# Patient Record
Sex: Female | Born: 1975 | Race: White | Hispanic: No | Marital: Single | Smoking: Current every day smoker
Health system: Southern US, Community
[De-identification: ages and names within clinical notes are randomized; demographics above are authoritative.]

## PROBLEM LIST (undated history)

## (undated) DIAGNOSIS — J9819 Other pulmonary collapse: Secondary | ICD-10-CM

## (undated) DIAGNOSIS — F1011 Alcohol abuse, in remission: Secondary | ICD-10-CM

## (undated) DIAGNOSIS — S62109A Fracture of unspecified carpal bone, unspecified wrist, initial encounter for closed fracture: Secondary | ICD-10-CM

---

## 1997-01-07 DIAGNOSIS — J9819 Other pulmonary collapse: Secondary | ICD-10-CM

## 1997-01-07 HISTORY — DX: Other pulmonary collapse: J98.19

## 2004-01-08 HISTORY — PX: HERNIA REPAIR: SHX51

## 2006-09-04 ENCOUNTER — Emergency Department (HOSPITAL_COMMUNITY): Admission: EM | Admit: 2006-09-04 | Discharge: 2006-09-04 | Payer: Self-pay | Admitting: Emergency Medicine

## 2011-02-20 ENCOUNTER — Encounter (HOSPITAL_COMMUNITY): Payer: Self-pay | Admitting: *Deleted

## 2011-02-20 ENCOUNTER — Emergency Department (HOSPITAL_COMMUNITY)
Admission: EM | Admit: 2011-02-20 | Discharge: 2011-02-20 | Disposition: A | Discharge status: Home / Self Care | Payer: PRIVATE HEALTH INSURANCE | Source: Home / Self Care

## 2011-02-20 DIAGNOSIS — R197 Diarrhea, unspecified: Secondary | ICD-10-CM

## 2011-02-20 NOTE — Discharge Instructions (Signed)
Increase clear fluids to avoid dehyrdation.  Avoid caffeinated beverage until your diarrhea improves. As your symptoms improve you can eat a light bland diet. See attached handout. Return if your symptoms change or worsen.

## 2011-02-20 NOTE — ED Notes (Signed)
Pt with onset of sinus congestion/body aches/chills/diarrhea Friday vomited x one this am

## 2011-02-20 NOTE — ED Provider Notes (Signed)
Medical screening examination/treatment/procedure(s) were performed by non-physician practitioner and as supervising physician I was immediately available for consultation/collaboration.  Corrie Mckusick, MD 02/20/11 1413
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
## 2011-07-07 ENCOUNTER — Emergency Department (HOSPITAL_COMMUNITY): Payer: PRIVATE HEALTH INSURANCE

## 2011-07-07 ENCOUNTER — Emergency Department (HOSPITAL_COMMUNITY)
Admission: EM | Admit: 2011-07-07 | Discharge: 2011-07-08 | Disposition: A | Discharge status: Home / Self Care | Payer: PRIVATE HEALTH INSURANCE | Attending: Emergency Medicine | Admitting: Emergency Medicine

## 2011-07-07 ENCOUNTER — Encounter (HOSPITAL_COMMUNITY): Payer: Self-pay | Admitting: Emergency Medicine

## 2011-07-07 DIAGNOSIS — F17200 Nicotine dependence, unspecified, uncomplicated: Secondary | ICD-10-CM | POA: Insufficient documentation

## 2011-07-07 DIAGNOSIS — S62609A Fracture of unspecified phalanx of unspecified finger, initial encounter for closed fracture: Secondary | ICD-10-CM

## 2011-07-07 DIAGNOSIS — IMO0002 Reserved for concepts with insufficient information to code with codable children: Secondary | ICD-10-CM | POA: Insufficient documentation
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
## 2011-07-08 DIAGNOSIS — S62109A Fracture of unspecified carpal bone, unspecified wrist, initial encounter for closed fracture: Secondary | ICD-10-CM

## 2011-07-08 HISTORY — DX: Fracture of unspecified carpal bone, unspecified wrist, initial encounter for closed fracture: S62.109A

## 2011-07-08 MED ORDER — KETOROLAC TROMETHAMINE 60 MG/2ML IM SOLN
60.0000 mg | Freq: Once | INTRAMUSCULAR | Status: AC
  Administered 2011-07-08: 60 mg via INTRAMUSCULAR
  Filled 2011-07-08: qty 2

## 2011-07-08 MED ORDER — HYDROCODONE-ACETAMINOPHEN 5-500 MG PO TABS: 1.0000 | ORAL_TABLET | Freq: Four times a day (QID) | ORAL | Status: AC | PRN

## 2011-07-08 NOTE — ED Provider Notes (Signed)
Medical screening examination/treatment/procedure(s) were performed by non-physician practitioner and as supervising physician I was immediately available for consultation/collaboration.    Alaysia Lightle D Parisa Pinela, MD 07/08/11 1657 
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
## 2011-07-08 NOTE — Discharge Instructions (Signed)
Finger Fracture Fractures of fingers are breaks in the bones of the fingers. There are many types of fractures. There are different ways of treating these fractures, all of which can be correct. Your caregiver will discuss the best way to treat your fracture. TREATMENT  Finger fractures can be treated with:   Non-reduction - this means the bones are in place. The finger is splinted without changing the positions of the bone pieces. The splint is usually left on for about a week to ten days. This will depend on your fracture and what your caregiver thinks.   Closed reduction - the bones are put back into position without using surgery. The finger is then splinted.   ORIF (open reduction and internal fixation) - the fracture site is opened. Then the bone pieces are fixed into place with pins or some type of hardware. This is seldom required. It depends on the severity of the fracture.  Your caregiver will discuss the type of fracture you have and the treatment that will be best for that problem. If surgery is the treatment of choice, the following is information for you to know and also let your caregiver know about prior to surgery. LET YOUR CAREGIVER KNOW ABOUT:  Allergies   Medications taken including herbs, eye drops, over the counter medications, and creams   Use of steroids (by mouth or creams)   Previous problems with anesthetics or Novocaine   Possibility of pregnancy, if this applies   History of blood clots (thrombophlebitis)   History of bleeding or blood problems   Previous surgery   Other health problems  AFTER THE PROCEDURE After surgery, you will be taken to the recovery area where a nurse will check your progress. Once you're awake, stable, and taking fluids well, barring other problems you will be allowed to go home. Once home an ice pack applied to your operative site may help with discomfort and keep the swelling down. HOME CARE INSTRUCTIONS   Follow your  caregiver's instructions as to activities, exercises, physical therapy, and driving a car.   Use your finger and exercise as directed.   Only take over-the-counter or prescription medicines for pain, discomfort, or fever as directed by your caregiver. Do not take aspirin until your caregiver OK's it, as this can increase bleeding immediately following surgery.   Stop using ibuprofen if it upsets your stomach. Let your caregiver know about it.  SEEK MEDICAL CARE IF:  You have increased bleeding (more than a small spot) from the wound or from beneath your splint.   You develop redness, swelling, or increasing pain in the wound or from beneath your splint.   There is pus coming from the wound or from beneath your splint.   An unexplained oral temperature above 102 F (38.9 C) develops, or as your caregiver suggests.   There is a foul smell coming from the wound or dressing or from beneath your splint.  SEEK IMMEDIATE MEDICAL CARE IF:   You develop a rash.   You have difficulty breathing.   You have any allergic problems.  MAKE SURE YOU:   Understand these instructions.   Will watch your condition.   Will get help right away if you are not doing well or get worse.  Document Released: 04/07/2000 Document Revised: 12/13/2010 Document Reviewed: 08/13/2007 Exit[employer] Patient Information 2012 [employer]. 

## 2011-07-23 ENCOUNTER — Encounter (HOSPITAL_COMMUNITY): Payer: Self-pay | Admitting: Pharmacy Technician

## 2011-07-24 ENCOUNTER — Encounter (HOSPITAL_COMMUNITY)
Admission: RE | Admit: 2011-07-24 | Discharge: 2011-07-24 | Disposition: A | Discharge status: Home / Self Care | Payer: PRIVATE HEALTH INSURANCE | Source: Ambulatory Visit | Attending: General Surgery | Admitting: General Surgery

## 2011-07-24 ENCOUNTER — Encounter (HOSPITAL_COMMUNITY): Payer: Self-pay

## 2011-07-24 HISTORY — DX: Other pulmonary collapse: J98.19

## 2011-07-24 HISTORY — DX: Fracture of unspecified carpal bone, unspecified wrist, initial encounter for closed fracture: S62.109A

## 2011-07-24 LAB — CBC
MCHC: 34.7 g/dL (ref 30.0–36.0)
Platelets: 189 10*3/uL (ref 150–400)
RDW: 12.8 % (ref 11.5–15.5)
WBC: 6 10*3/uL (ref 4.0–10.5)

## 2011-07-24 LAB — SURGICAL PCR SCREEN
MRSA, PCR: NEGATIVE
Staphylococcus aureus: NEGATIVE

## 2011-07-24 LAB — HCG, SERUM, QUALITATIVE: Preg, Serum: NEGATIVE

## 2011-07-24 MED ORDER — CHLORHEXIDINE GLUCONATE 4 % EX LIQD: 1.0000 "application " | Freq: Once | CUTANEOUS | Status: DC

## 2011-07-24 MED ORDER — CEFAZOLIN SODIUM-DEXTROSE 2-3 GM-% IV SOLR
2.0000 g | INTRAVENOUS | Status: AC
  Administered 2011-07-25: 2 g via INTRAVENOUS

## 2011-07-24 NOTE — Pre-Procedure Instructions (Signed)
18 Ashley Poole  07/24/2011   Your procedure is scheduled on:  Thursday, July 18  Report to [facility] at 1030 AM.  Call this number if you have problems the morning of surgery: [phone]   Remember:   Do not eat food or drink liquids:After Midnight.  May have clear liquids:until Midnight .     Take these medicines the morning of surgery with A SIP OF WATER: *Hydrocodone**   Do not wear jewelry, make-up or nail polish.  Do not wear lotions, powders, or perfumes. You may wear deodorant.  Do not shave 48 hours prior to surgery. Men may shave face and neck.  Do not bring valuables to the hospital.  Contacts, dentures or bridgework may not be worn into surgery.  Leave suitcase in the car. After surgery it may be brought to your room.  For patients admitted to the hospital, checkout time is 11:00 AM the day of discharge.   Patients discharged the day of surgery will not be allowed to drive home.  Name and phone number of your driver: Barrie Lyme , [phone]**  Special Instructions: CHG Shower Use Special Wash: 1/2 bottle night before surgery and 1/2 bottle morning of surgery.   Please read over the following fact sheets that you were given: Pain Booklet, Coughing and Deep Breathing, MRSA Information and Surgical Site Infection Prevention

## 2011-07-25 ENCOUNTER — Ambulatory Visit (HOSPITAL_COMMUNITY): Payer: PRIVATE HEALTH INSURANCE | Admitting: Anesthesiology

## 2011-07-25 ENCOUNTER — Ambulatory Visit (HOSPITAL_COMMUNITY)
Admission: RE | Admit: 2011-07-25 | Discharge: 2011-07-25 | Disposition: A | Discharge status: Home / Self Care | Payer: PRIVATE HEALTH INSURANCE | Source: Ambulatory Visit | Attending: General Surgery | Admitting: General Surgery

## 2011-07-25 ENCOUNTER — Encounter (HOSPITAL_COMMUNITY)
Admission: RE | Disposition: A | Discharge status: Home / Self Care | Payer: Self-pay | Source: Ambulatory Visit | Attending: General Surgery

## 2011-07-25 ENCOUNTER — Encounter (HOSPITAL_COMMUNITY): Payer: Self-pay | Admitting: Anesthesiology

## 2011-07-25 ENCOUNTER — Encounter (HOSPITAL_COMMUNITY): Payer: Self-pay | Admitting: *Deleted

## 2011-07-25 DIAGNOSIS — IMO0002 Reserved for concepts with insufficient information to code with codable children: Secondary | ICD-10-CM | POA: Insufficient documentation

## 2011-07-25 DIAGNOSIS — F17200 Nicotine dependence, unspecified, uncomplicated: Secondary | ICD-10-CM | POA: Insufficient documentation

## 2011-07-25 DIAGNOSIS — Z01812 Encounter for preprocedural laboratory examination: Secondary | ICD-10-CM | POA: Insufficient documentation

## 2011-07-25 HISTORY — PX: ORIF FINGER FRACTURE: SHX2122

## 2011-07-25 SURGERY — OPEN REDUCTION INTERNAL FIXATION (ORIF) METACARPAL (FINGER) FRACTURE
Anesthesia: Choice | Site: Finger | Laterality: Left | Wound class: Clean

## 2011-07-25 MED ORDER — LACTATED RINGERS IV SOLN
INTRAVENOUS | Status: DC
  Administered 2011-07-25: 12:00:00 via INTRAVENOUS

## 2011-07-25 MED ORDER — PROPOFOL 10 MG/ML IV EMUL
INTRAVENOUS | Status: DC | PRN
  Administered 2011-07-25: 200 mg via INTRAVENOUS

## 2011-07-25 MED ORDER — 0.9 % SODIUM CHLORIDE (POUR BTL) OPTIME
TOPICAL | Status: DC | PRN
  Administered 2011-07-25: 1000 mL

## 2011-07-25 MED ORDER — BUPIVACAINE HCL (PF) 0.25 % IJ SOLN
INTRAMUSCULAR | Status: DC | PRN
  Administered 2011-07-25: 7 mL

## 2011-07-25 MED ORDER — ONDANSETRON HCL 4 MG/2ML IJ SOLN: 4.0000 mg | Freq: Once | INTRAMUSCULAR | Status: DC | PRN

## 2011-07-25 MED ORDER — CEFAZOLIN SODIUM-DEXTROSE 2-3 GM-% IV SOLR
INTRAVENOUS | Status: AC
  Filled 2011-07-25: qty 50

## 2011-07-25 MED ORDER — BUPIVACAINE HCL (PF) 0.25 % IJ SOLN
INTRAMUSCULAR | Status: AC
  Filled 2011-07-25: qty 30

## 2011-07-25 MED ORDER — ONDANSETRON HCL 4 MG/2ML IJ SOLN
INTRAMUSCULAR | Status: DC | PRN
  Administered 2011-07-25: 4 mg via INTRAVENOUS

## 2011-07-25 MED ORDER — FENTANYL CITRATE 0.05 MG/ML IJ SOLN
INTRAMUSCULAR | Status: DC | PRN
  Administered 2011-07-25: 100 ug via INTRAVENOUS
  Administered 2011-07-25: 25 ug via INTRAVENOUS
  Administered 2011-07-25 (×2): 50 ug via INTRAVENOUS
  Administered 2011-07-25: 25 ug via INTRAVENOUS

## 2011-07-25 MED ORDER — HYDROMORPHONE HCL PF 1 MG/ML IJ SOLN: 0.2500 mg | INTRAMUSCULAR | Status: DC | PRN

## 2011-07-25 MED ORDER — HYDROMORPHONE HCL PF 1 MG/ML IJ SOLN
INTRAMUSCULAR | Status: AC
  Administered 2011-07-25: 0.5 mg
  Administered 2011-07-25: 0.5 mg via INTRAVENOUS
  Filled 2011-07-25: qty 2

## 2011-07-25 MED ORDER — MIDAZOLAM HCL 5 MG/5ML IJ SOLN
INTRAMUSCULAR | Status: DC | PRN
  Administered 2011-07-25: 2 mg via INTRAVENOUS

## 2011-07-25 MED ORDER — LIDOCAINE HCL (CARDIAC) 20 MG/ML IV SOLN
INTRAVENOUS | Status: DC | PRN
  Administered 2011-07-25: 60 mg via INTRAVENOUS

## 2011-07-25 MED ORDER — LACTATED RINGERS IV SOLN
INTRAVENOUS | Status: DC | PRN
  Administered 2011-07-25: 12:00:00 via INTRAVENOUS

## 2011-07-25 SURGICAL SUPPLY — 58 items
BANDAGE COBAN STERILE 2 (GAUZE/BANDAGES/DRESSINGS) ×2 IMPLANT
BANDAGE CONFORM 2  STR LF (GAUZE/BANDAGES/DRESSINGS) ×2 IMPLANT
BANDAGE ELASTIC 3 VELCRO ST LF (GAUZE/BANDAGES/DRESSINGS) IMPLANT
BANDAGE ELASTIC 4 VELCRO ST LF (GAUZE/BANDAGES/DRESSINGS) IMPLANT
BANDAGE GAUZE ELAST BULKY 4 IN (GAUZE/BANDAGES/DRESSINGS) IMPLANT
BNDG COHESIVE 1X5 TAN STRL LF (GAUZE/BANDAGES/DRESSINGS) IMPLANT
CAP PIN ORTHO PINK (CAP) IMPLANT
CAP PIN PROTECTOR ORTHO WHT (CAP) IMPLANT
CLOTH BEACON ORANGE TIMEOUT ST (SAFETY) ×2 IMPLANT
CORDS BIPOLAR (ELECTRODE) ×2 IMPLANT
COVER SURGICAL LIGHT HANDLE (MISCELLANEOUS) ×2 IMPLANT
CUFF TOURNIQUET SINGLE 18IN (TOURNIQUET CUFF) ×2 IMPLANT
CUFF TOURNIQUET SINGLE 24IN (TOURNIQUET CUFF) IMPLANT
DRAPE OEC MINIVIEW 54X84 (DRAPES) ×2 IMPLANT
DRAPE SURG 17X23 STRL (DRAPES) ×2 IMPLANT
GAUZE SPONGE 2X2 8PLY STRL LF (GAUZE/BANDAGES/DRESSINGS) IMPLANT
GAUZE XEROFORM 1X8 LF (GAUZE/BANDAGES/DRESSINGS) ×2 IMPLANT
GLOVE BIOGEL M STRL SZ7.5 (GLOVE) ×2 IMPLANT
GLOVE BIOGEL PI IND STRL 7.0 (GLOVE) ×1 IMPLANT
GLOVE BIOGEL PI INDICATOR 7.0 (GLOVE) ×1
GLOVE ECLIPSE 6.5 STRL STRAW (GLOVE) ×2 IMPLANT
GLOVE SS BIOGEL STRL SZ 8 (GLOVE) ×1 IMPLANT
GLOVE SUPERSENSE BIOGEL SZ 8 (GLOVE) ×1
GOWN PREVENTION PLUS XLARGE (GOWN DISPOSABLE) IMPLANT
GOWN STRL NON-REIN LRG LVL3 (GOWN DISPOSABLE) ×4 IMPLANT
GOWN STRL REIN XL XLG (GOWN DISPOSABLE) IMPLANT
K-WIRE SMTH SNGL TROCAR .028X4 (WIRE)
KIT BASIN OR (CUSTOM PROCEDURE TRAY) ×2 IMPLANT
KIT ROOM TURNOVER OR (KITS) ×2 IMPLANT
KWIRE SMTH SNGL TROCAR .028X4 (WIRE) IMPLANT
MANIFOLD NEPTUNE II (INSTRUMENTS) ×2 IMPLANT
NEEDLE HYPO 25GX1X1/2 BEV (NEEDLE) ×2 IMPLANT
NS IRRIG 1000ML POUR BTL (IV SOLUTION) ×2 IMPLANT
PACK ORTHO EXTREMITY (CUSTOM PROCEDURE TRAY) ×2 IMPLANT
PAD ARMBOARD 7.5X6 YLW CONV (MISCELLANEOUS) ×2 IMPLANT
PAD CAST 4YDX4 CTTN HI CHSV (CAST SUPPLIES) IMPLANT
PADDING CAST COTTON 4X4 STRL (CAST SUPPLIES)
SCREW CORTEX 1.5X8 (Screw) ×4 IMPLANT
SCREW CORTEX ST 2.0X8 (Screw) ×2 IMPLANT
SOLUTION BETADINE 4OZ (MISCELLANEOUS) ×2 IMPLANT
SPONGE GAUZE 2X2 STER 10/PKG (GAUZE/BANDAGES/DRESSINGS)
SPONGE GAUZE 4X4 12PLY (GAUZE/BANDAGES/DRESSINGS) IMPLANT
SPONGE SCRUB IODOPHOR (GAUZE/BANDAGES/DRESSINGS) ×2 IMPLANT
SUCTION FRAZIER TIP 10 FR DISP (SUCTIONS) ×2 IMPLANT
SUT ETHILON 5 0 P 3 18 (SUTURE) ×1
SUT FIBERWIRE 4-0 18 TAPR NDL (SUTURE) ×2
SUT MERSILENE 4 0 P 3 (SUTURE) IMPLANT
SUT NYLON ETHILON 5-0 P-3 1X18 (SUTURE) ×1 IMPLANT
SUT PROLENE 4 0 PS 2 18 (SUTURE) IMPLANT
SUT VIC AB 2-0 CT1 27 (SUTURE)
SUT VIC AB 2-0 CT1 TAPERPNT 27 (SUTURE) IMPLANT
SUTURE FIBERWR 4-0 18 TAPR NDL (SUTURE) ×1 IMPLANT
SYR CONTROL 10ML LL (SYRINGE) ×2 IMPLANT
TOWEL OR 17X24 6PK STRL BLUE (TOWEL DISPOSABLE) ×2 IMPLANT
TOWEL OR 17X26 10 PK STRL BLUE (TOWEL DISPOSABLE) ×2 IMPLANT
TUBE CONNECTING 12X1/4 (SUCTIONS) IMPLANT
UNDERPAD 30X30 INCONTINENT (UNDERPADS AND DIAPERS) ×2 IMPLANT
WATER STERILE IRR 1000ML POUR (IV SOLUTION) ×2 IMPLANT

## 2011-07-25 NOTE — Anesthesia Preprocedure Evaluation (Signed)
Anesthesia Evaluation  Patient identified by MRN, date of birth, ID band Patient awake    Reviewed: Allergy & Precautions, H&P , NPO status , Patient's Chart, lab work & pertinent test results  Airway Mallampati: II TM Distance: >3 FB Neck ROM: Full    Dental  (+) Teeth Intact and Chipped   Pulmonary Current Smoker,    Pulmonary exam normal       Cardiovascular     Neuro/Psych    GI/Hepatic   Endo/Other    Renal/GU      Musculoskeletal   Abdominal Normal abdominal exam  (+)   Peds  Hematology   Anesthesia Other Findings   Reproductive/Obstetrics                           Anesthesia Physical Anesthesia Plan  ASA: II  Anesthesia Plan: General   Post-op Pain Management:    Induction: Intravenous  Airway Management Planned: LMA  Additional Equipment:   Intra-op Plan:   Post-operative Plan: Extubation in OR  Informed Consent: I have reviewed the patients History and Physical, chart, labs and discussed the procedure including the risks, benefits and alternatives for the proposed anesthesia with the patient or authorized representative who has indicated his/her understanding and acceptance.   Dental advisory given  Plan Discussed with: CRNA, Anesthesiologist and Surgeon  Anesthesia Plan Comments:         Anesthesia Quick Evaluation

## 2011-07-25 NOTE — Preoperative (Signed)
Beta Blockers   Reason not to administer Beta Blockers:Not Applicable 

## 2011-07-25 NOTE — H&P (Signed)
I have seen and examined this patient prior to her surgery 07/25/2011.  There have no changes to her h&p since she was evaluated in my office.  I have discussed the surgical plan and answered all of her questions.  I will proceed with ORIF of her finger as scheduled.

## 2011-07-25 NOTE — Addendum Note (Signed)
Addendum  created 07/25/11 1456 by Rivka Barbara, MD   Modules edited:Orders, PRL Based Order Sets

## 2011-07-25 NOTE — Op Note (Signed)
07/25/2011  1:53 PM  PATIENT:  Ashley Poole  36 y.o. female  PRE-OPERATIVE DIAGNOSIS:  fracture left ring proximal phalanx  POST-OPERATIVE DIAGNOSIS:  fracture left ring proximal phalanx  PROCEDURE:  Procedure(s): OPEN REDUCTION INTERNAL FIXATION (ORIF) Phalanx (FINGER) FRACTURE  SURGEON:  Surgeon(s): Johnette Abraham, MD  ANESTHESIA:   general  SPECIMEN:  No Specimen  FINDINGS:  Oblique fracture  DISPOSITION OF SPECIMEN:    PATIENT DISPOSITION:  PACU - hemodynamically stable.

## 2011-07-25 NOTE — Anesthesia Postprocedure Evaluation (Signed)
  Anesthesia Post-op Note  Patient: Ashley Poole  Procedure(s) Performed: Procedure(s) (LRB): OPEN REDUCTION INTERNAL FIXATION (ORIF) METACARPAL (FINGER) FRACTURE (Left)  Patient Location: PACU  Anesthesia Type: General  Level of Consciousness: awake, alert , oriented and patient cooperative  Airway and Oxygen Therapy: Patient Spontanous Breathing and Patient connected to nasal cannula oxygen  Post-op Pain: mild  Post-op Assessment: Post-op Vital signs reviewed, Patient's Cardiovascular Status Stable, Respiratory Function Stable, Patent Airway, No signs of Nausea or vomiting and Pain level controlled  Post-op Vital Signs: stable  Complications: No apparent anesthesia complications

## 2011-07-25 NOTE — Transfer of Care (Signed)
Immediate Anesthesia Transfer of Care Note  Patient: Ashley Poole  Procedure(s) Performed: Procedure(s) (LRB): OPEN REDUCTION INTERNAL FIXATION (ORIF) METACARPAL (FINGER) FRACTURE (Left)  Patient Location: PACU  Anesthesia Type: General  Level of Consciousness: awake, alert  and oriented  Airway & Oxygen Therapy: Patient Spontanous Breathing and Patient connected to nasal cannula oxygen  Post-op Assessment: Report given to PACU RN  Post vital signs: Reviewed and stable  Complications: No apparent anesthesia complications

## 2011-07-26 ENCOUNTER — Encounter (HOSPITAL_COMMUNITY): Payer: Self-pay | Admitting: General Surgery
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
## 2013-06-25 ENCOUNTER — Emergency Department (HOSPITAL_COMMUNITY)
Admission: EM | Admit: 2013-06-25 | Discharge: 2013-06-26 | Disposition: A | Discharge status: Other Acute Inpatient Hospital | Payer: Federal, State, Local not specified - Other | Attending: Emergency Medicine | Admitting: Emergency Medicine

## 2013-06-25 ENCOUNTER — Encounter (HOSPITAL_COMMUNITY): Payer: Self-pay | Admitting: Emergency Medicine

## 2013-06-25 DIAGNOSIS — Z8781 Personal history of (healed) traumatic fracture: Secondary | ICD-10-CM | POA: Insufficient documentation

## 2013-06-25 DIAGNOSIS — F10230 Alcohol dependence with withdrawal, uncomplicated: Secondary | ICD-10-CM

## 2013-06-25 DIAGNOSIS — F329 Major depressive disorder, single episode, unspecified: Secondary | ICD-10-CM | POA: Diagnosis present

## 2013-06-25 DIAGNOSIS — Z79899 Other long term (current) drug therapy: Secondary | ICD-10-CM | POA: Insufficient documentation

## 2013-06-25 DIAGNOSIS — H5789 Other specified disorders of eye and adnexa: Secondary | ICD-10-CM | POA: Insufficient documentation

## 2013-06-25 DIAGNOSIS — F1020 Alcohol dependence, uncomplicated: Secondary | ICD-10-CM | POA: Diagnosis present

## 2013-06-25 DIAGNOSIS — R000 Tachycardia, unspecified: Secondary | ICD-10-CM | POA: Insufficient documentation

## 2013-06-25 DIAGNOSIS — F17200 Nicotine dependence, unspecified, uncomplicated: Secondary | ICD-10-CM | POA: Insufficient documentation

## 2013-06-25 DIAGNOSIS — F1010 Alcohol abuse, uncomplicated: Secondary | ICD-10-CM | POA: Insufficient documentation

## 2013-06-25 DIAGNOSIS — F10220 Alcohol dependence with intoxication, uncomplicated: Secondary | ICD-10-CM | POA: Diagnosis present

## 2013-06-25 DIAGNOSIS — Z8709 Personal history of other diseases of the respiratory system: Secondary | ICD-10-CM | POA: Insufficient documentation
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
## 2013-06-26 ENCOUNTER — Inpatient Hospital Stay (HOSPITAL_COMMUNITY)
Admission: AD | Admit: 2013-06-26 | Discharge: 2013-07-01 | DRG: 897 | Disposition: A | Discharge status: Rehabilitation Facility | Payer: Federal, State, Local not specified - Other | Source: Intra-hospital | Attending: Psychiatry | Admitting: Psychiatry

## 2013-06-26 ENCOUNTER — Encounter (HOSPITAL_COMMUNITY): Payer: Self-pay | Admitting: *Deleted

## 2013-06-26 ENCOUNTER — Encounter (HOSPITAL_COMMUNITY): Payer: Self-pay | Admitting: Psychiatry

## 2013-06-26 DIAGNOSIS — Z5987 Material hardship due to limited financial resources, not elsewhere classified: Secondary | ICD-10-CM

## 2013-06-26 DIAGNOSIS — IMO0002 Reserved for concepts with insufficient information to code with codable children: Secondary | ICD-10-CM

## 2013-06-26 DIAGNOSIS — F1994 Other psychoactive substance use, unspecified with psychoactive substance-induced mood disorder: Secondary | ICD-10-CM | POA: Diagnosis present

## 2013-06-26 DIAGNOSIS — F329 Major depressive disorder, single episode, unspecified: Secondary | ICD-10-CM | POA: Diagnosis present

## 2013-06-26 DIAGNOSIS — F10229 Alcohol dependence with intoxication, unspecified: Secondary | ICD-10-CM

## 2013-06-26 DIAGNOSIS — F4310 Post-traumatic stress disorder, unspecified: Secondary | ICD-10-CM | POA: Diagnosis present

## 2013-06-26 DIAGNOSIS — F1020 Alcohol dependence, uncomplicated: Principal | ICD-10-CM | POA: Diagnosis present

## 2013-06-26 DIAGNOSIS — Z5900 Homelessness unspecified: Secondary | ICD-10-CM

## 2013-06-26 DIAGNOSIS — G4700 Insomnia, unspecified: Secondary | ICD-10-CM | POA: Diagnosis present

## 2013-06-26 DIAGNOSIS — F10220 Alcohol dependence with intoxication, uncomplicated: Secondary | ICD-10-CM | POA: Diagnosis present

## 2013-06-26 DIAGNOSIS — F1010 Alcohol abuse, uncomplicated: Secondary | ICD-10-CM

## 2013-06-26 DIAGNOSIS — F332 Major depressive disorder, recurrent severe without psychotic features: Secondary | ICD-10-CM | POA: Diagnosis present

## 2013-06-26 DIAGNOSIS — F17200 Nicotine dependence, unspecified, uncomplicated: Secondary | ICD-10-CM | POA: Diagnosis present

## 2013-06-26 DIAGNOSIS — Z598 Other problems related to housing and economic circumstances: Secondary | ICD-10-CM

## 2013-06-26 DIAGNOSIS — R51 Headache: Secondary | ICD-10-CM | POA: Diagnosis present

## 2013-06-26 LAB — CBC
HCT: 33.1 % — ABNORMAL LOW (ref 36.0–46.0)
Hemoglobin: 11.4 g/dL — ABNORMAL LOW (ref 12.0–15.0)
MCH: 35.1 pg — ABNORMAL HIGH (ref 26.0–34.0)
MCHC: 34.4 g/dL (ref 30.0–36.0)
MCV: 101.8 fL — ABNORMAL HIGH (ref 78.0–100.0)
Platelets: 180 10*3/uL (ref 150–400)
RBC: 3.25 MIL/uL — ABNORMAL LOW (ref 3.87–5.11)
RDW: 12.9 % (ref 11.5–15.5)
WBC: 5.5 10*3/uL (ref 4.0–10.5)

## 2013-06-26 LAB — RAPID URINE DRUG SCREEN, HOSP PERFORMED
Amphetamines: NOT DETECTED
Barbiturates: NOT DETECTED
Benzodiazepines: NOT DETECTED
Cocaine: NOT DETECTED
Opiates: NOT DETECTED
Tetrahydrocannabinol: NOT DETECTED

## 2013-06-26 LAB — COMPREHENSIVE METABOLIC PANEL
ALBUMIN: 4 g/dL (ref 3.5–5.2)
ALK PHOS: 87 U/L (ref 39–117)
ALT: 34 U/L (ref 0–35)
AST: 42 U/L — ABNORMAL HIGH (ref 0–37)
BILIRUBIN TOTAL: 0.2 mg/dL — AB (ref 0.3–1.2)
BUN: 4 mg/dL — ABNORMAL LOW (ref 6–23)
CO2: 24 mEq/L (ref 19–32)
Calcium: 8.9 mg/dL (ref 8.4–10.5)
Chloride: 100 mEq/L (ref 96–112)
Creatinine, Ser: 0.59 mg/dL (ref 0.50–1.10)
GFR calc Af Amer: >90 mL/min (ref >90)
GFR calc non Af Amer: >90 mL/min (ref >90)
Glucose, Bld: 103 mg/dL — ABNORMAL HIGH (ref 70–99)
Potassium: 3.7 mEq/L (ref 3.7–5.3)
Sodium: 141 mEq/L (ref 137–147)
Total Protein: 8.1 g/dL (ref 6.0–8.3)

## 2013-06-26 LAB — SALICYLATE LEVEL: Salicylate Lvl: <2 mg/dL — ABNORMAL LOW (ref 2.8–20.0)

## 2013-06-26 LAB — ACETAMINOPHEN LEVEL

## 2013-06-26 LAB — ETHANOL: Alcohol, Ethyl (B): 340 mg/dL — ABNORMAL HIGH (ref 0–11)

## 2013-06-26 MED ORDER — ALUM & MAG HYDROXIDE-SIMETH 200-200-20 MG/5ML PO SUSP: 30.0000 mL | ORAL | Status: DC | PRN

## 2013-06-26 MED ORDER — NICOTINE 21 MG/24HR TD PT24
21.0000 mg | MEDICATED_PATCH | Freq: Every day | TRANSDERMAL | Status: DC
  Administered 2013-06-27 – 2013-07-01 (×5): 21 mg via TRANSDERMAL
  Filled 2013-06-26 (×6): qty 1

## 2013-06-26 MED ORDER — MAGNESIUM HYDROXIDE 400 MG/5ML PO SUSP
30.0000 mL | Freq: Every day | ORAL | Status: DC | PRN
Start: 2013-06-26 — End: 2013-07-01
  Administered 2013-06-30: 30 mL via ORAL

## 2013-06-26 MED ORDER — ZOLPIDEM TARTRATE 5 MG PO TABS
5.0000 mg | ORAL_TABLET | Freq: Every evening | ORAL | Status: DC | PRN
  Administered 2013-06-26 – 2013-06-28 (×3): 5 mg via ORAL
  Filled 2013-06-26 (×3): qty 1

## 2013-06-26 MED ORDER — NICOTINE 21 MG/24HR TD PT24
21.0000 mg | MEDICATED_PATCH | Freq: Every day | TRANSDERMAL | Status: DC
  Administered 2013-06-26 (×2): 21 mg via TRANSDERMAL
  Filled 2013-06-26 (×2): qty 1

## 2013-06-26 MED ORDER — ONDANSETRON 4 MG PO TBDP: 4.0000 mg | ORAL_TABLET | Freq: Four times a day (QID) | ORAL | Status: AC | PRN

## 2013-06-26 MED ORDER — ONDANSETRON HCL 4 MG PO TABS: 4.0000 mg | ORAL_TABLET | Freq: Three times a day (TID) | ORAL | Status: DC | PRN

## 2013-06-26 MED ORDER — CHLORDIAZEPOXIDE HCL 25 MG PO CAPS
25.0000 mg | ORAL_CAPSULE | Freq: Three times a day (TID) | ORAL | Status: AC
  Administered 2013-06-27 – 2013-06-28 (×3): 25 mg via ORAL
  Filled 2013-06-26 (×3): qty 1

## 2013-06-26 MED ORDER — VITAMIN B-1 100 MG PO TABS
100.0000 mg | ORAL_TABLET | Freq: Every day | ORAL | Status: DC
  Administered 2013-06-27 – 2013-06-30 (×4): 100 mg via ORAL
  Filled 2013-06-26 (×6): qty 1

## 2013-06-26 MED ORDER — OXYCODONE HCL 5 MG PO TABS
5.0000 mg | ORAL_TABLET | ORAL | Status: DC | PRN
  Administered 2013-06-26: 5 mg via ORAL
  Filled 2013-06-26: qty 1

## 2013-06-26 MED ORDER — CHLORDIAZEPOXIDE HCL 25 MG PO CAPS
25.0000 mg | ORAL_CAPSULE | Freq: Four times a day (QID) | ORAL | Status: DC
  Administered 2013-06-26 (×2): 25 mg via ORAL
  Filled 2013-06-26 (×2): qty 1

## 2013-06-26 MED ORDER — VITAMIN B-1 100 MG PO TABS
100.0000 mg | ORAL_TABLET | Freq: Every day | ORAL | Status: DC
  Administered 2013-06-26: 100 mg via ORAL
  Filled 2013-06-26: qty 1

## 2013-06-26 MED ORDER — ACETAMINOPHEN 325 MG PO TABS: 650.0000 mg | ORAL_TABLET | Freq: Four times a day (QID) | ORAL | Status: DC | PRN

## 2013-06-26 MED ORDER — DOCUSATE SODIUM 100 MG PO CAPS
100.0000 mg | ORAL_CAPSULE | Freq: Two times a day (BID) | ORAL | Status: DC
  Administered 2013-06-26 – 2013-06-30 (×9): 100 mg via ORAL
  Filled 2013-06-26 (×9): qty 1
  Filled 2013-06-26: qty 28
  Filled 2013-06-26 (×2): qty 1
  Filled 2013-06-26: qty 28

## 2013-06-26 MED ORDER — LORAZEPAM 1 MG PO TABS
0.0000 mg | ORAL_TABLET | Freq: Two times a day (BID) | ORAL | Status: DC
Start: 2013-06-26 — End: 2013-06-26
  Administered 2013-06-26: 1 mg via ORAL
  Filled 2013-06-26: qty 1

## 2013-06-26 MED ORDER — MUPIROCIN 2 % EX OINT
1.0000 "application " | TOPICAL_OINTMENT | Freq: Three times a day (TID) | CUTANEOUS | Status: DC
  Administered 2013-06-26 – 2013-06-30 (×10): 1 via TOPICAL
  Filled 2013-06-26: qty 22

## 2013-06-26 MED ORDER — OXYCODONE HCL 5 MG PO TABS
5.0000 mg | ORAL_TABLET | ORAL | Status: DC | PRN
  Administered 2013-06-26 – 2013-06-30 (×14): 5 mg via ORAL
  Filled 2013-06-26 (×14): qty 1

## 2013-06-26 MED ORDER — THIAMINE HCL 100 MG/ML IJ SOLN: 100.0000 mg | Freq: Every day | INTRAMUSCULAR | Status: DC

## 2013-06-26 MED ORDER — LORAZEPAM 2 MG/ML IJ SOLN: 0.0000 mg | Freq: Four times a day (QID) | INTRAMUSCULAR | Status: DC

## 2013-06-26 MED ORDER — CHLORDIAZEPOXIDE HCL 25 MG PO CAPS
25.0000 mg | ORAL_CAPSULE | Freq: Four times a day (QID) | ORAL | Status: DC | PRN
Start: 2013-06-26 — End: 2013-06-26

## 2013-06-26 MED ORDER — ADULT MULTIVITAMIN W/MINERALS CH
1.0000 | ORAL_TABLET | Freq: Every day | ORAL | Status: DC
  Administered 2013-06-26: 1 via ORAL
  Filled 2013-06-26: qty 1

## 2013-06-26 MED ORDER — LOPERAMIDE HCL 2 MG PO CAPS: 2.0000 mg | ORAL_CAPSULE | ORAL | Status: DC | PRN

## 2013-06-26 MED ORDER — CHLORDIAZEPOXIDE HCL 25 MG PO CAPS: 25.0000 mg | ORAL_CAPSULE | Freq: Every day | ORAL | Status: DC

## 2013-06-26 MED ORDER — HYDROXYZINE HCL 25 MG PO TABS: 25.0000 mg | ORAL_TABLET | Freq: Four times a day (QID) | ORAL | Status: DC | PRN

## 2013-06-26 MED ORDER — CHLORDIAZEPOXIDE HCL 25 MG PO CAPS
25.0000 mg | ORAL_CAPSULE | ORAL | Status: AC
  Administered 2013-06-28 – 2013-06-29 (×2): 25 mg via ORAL
  Filled 2013-06-26 (×2): qty 1

## 2013-06-26 MED ORDER — CHLORDIAZEPOXIDE HCL 25 MG PO CAPS
25.0000 mg | ORAL_CAPSULE | Freq: Every day | ORAL | Status: AC
  Administered 2013-06-30: 25 mg via ORAL
  Filled 2013-06-26: qty 1

## 2013-06-26 MED ORDER — IBUPROFEN 200 MG PO TABS
600.0000 mg | ORAL_TABLET | Freq: Three times a day (TID) | ORAL | Status: DC | PRN
  Administered 2013-06-26: 600 mg via ORAL
  Filled 2013-06-26: qty 3

## 2013-06-26 MED ORDER — ONDANSETRON 4 MG PO TBDP: 4.0000 mg | ORAL_TABLET | Freq: Four times a day (QID) | ORAL | Status: DC | PRN

## 2013-06-26 MED ORDER — IBUPROFEN 800 MG PO TABS
800.0000 mg | ORAL_TABLET | Freq: Once | ORAL | Status: AC
  Administered 2013-06-26: 800 mg via ORAL
  Filled 2013-06-26: qty 1

## 2013-06-26 MED ORDER — CYCLOBENZAPRINE HCL 10 MG PO TABS
5.0000 mg | ORAL_TABLET | Freq: Three times a day (TID) | ORAL | Status: DC | PRN
  Administered 2013-06-26 – 2013-07-01 (×6): 5 mg via ORAL
  Filled 2013-06-26 (×6): qty 1

## 2013-06-26 MED ORDER — IBUPROFEN 600 MG PO TABS
600.0000 mg | ORAL_TABLET | Freq: Three times a day (TID) | ORAL | Status: DC | PRN
  Administered 2013-06-27: 600 mg via ORAL
  Filled 2013-06-26: qty 1

## 2013-06-26 MED ORDER — ZOLPIDEM TARTRATE 5 MG PO TABS: 5.0000 mg | ORAL_TABLET | Freq: Every evening | ORAL | Status: DC | PRN

## 2013-06-26 MED ORDER — HYDROXYZINE HCL 25 MG PO TABS
25.0000 mg | ORAL_TABLET | Freq: Four times a day (QID) | ORAL | Status: AC | PRN
  Administered 2013-06-26 – 2013-06-28 (×3): 25 mg via ORAL
  Filled 2013-06-26 (×3): qty 1

## 2013-06-26 MED ORDER — CHLORDIAZEPOXIDE HCL 25 MG PO CAPS
25.0000 mg | ORAL_CAPSULE | Freq: Four times a day (QID) | ORAL | Status: AC | PRN
  Administered 2013-06-26 – 2013-06-27 (×2): 25 mg via ORAL
  Filled 2013-06-26 (×2): qty 1

## 2013-06-26 MED ORDER — CYCLOBENZAPRINE HCL 10 MG PO TABS: 5.0000 mg | ORAL_TABLET | Freq: Three times a day (TID) | ORAL | Status: DC | PRN

## 2013-06-26 MED ORDER — MUPIROCIN 2 % EX OINT
1.0000 "application " | TOPICAL_OINTMENT | Freq: Three times a day (TID) | CUTANEOUS | Status: DC
  Administered 2013-06-26: 1 via NASAL
  Filled 2013-06-26: qty 22

## 2013-06-26 MED ORDER — CHLORDIAZEPOXIDE HCL 25 MG PO CAPS
25.0000 mg | ORAL_CAPSULE | Freq: Four times a day (QID) | ORAL | Status: AC
  Administered 2013-06-26 – 2013-06-27 (×4): 25 mg via ORAL
  Filled 2013-06-26 (×4): qty 1

## 2013-06-26 MED ORDER — CHLORDIAZEPOXIDE HCL 25 MG PO CAPS: 25.0000 mg | ORAL_CAPSULE | ORAL | Status: DC

## 2013-06-26 MED ORDER — LORAZEPAM 1 MG PO TABS: 0.0000 mg | ORAL_TABLET | Freq: Four times a day (QID) | ORAL | Status: DC

## 2013-06-26 MED ORDER — ACETAMINOPHEN 325 MG PO TABS
650.0000 mg | ORAL_TABLET | Freq: Four times a day (QID) | ORAL | Status: DC | PRN
  Administered 2013-06-26: 650 mg via ORAL
  Filled 2013-06-26: qty 2

## 2013-06-26 MED ORDER — ADULT MULTIVITAMIN W/MINERALS CH
1.0000 | ORAL_TABLET | Freq: Every day | ORAL | Status: DC
  Administered 2013-06-27 – 2013-06-30 (×4): 1 via ORAL
  Filled 2013-06-26 (×6): qty 1

## 2013-06-26 MED ORDER — LORAZEPAM 1 MG PO TABS: 1.0000 mg | ORAL_TABLET | Freq: Three times a day (TID) | ORAL | Status: DC | PRN

## 2013-06-26 MED ORDER — CHLORDIAZEPOXIDE HCL 25 MG PO CAPS: 25.0000 mg | ORAL_CAPSULE | Freq: Three times a day (TID) | ORAL | Status: DC

## 2013-06-26 MED ORDER — LORAZEPAM 2 MG/ML IJ SOLN: 0.0000 mg | Freq: Two times a day (BID) | INTRAMUSCULAR | Status: DC

## 2013-06-26 MED ORDER — DOCUSATE SODIUM 100 MG PO CAPS
100.0000 mg | ORAL_CAPSULE | Freq: Two times a day (BID) | ORAL | Status: DC
  Administered 2013-06-26: 100 mg via ORAL
  Filled 2013-06-26 (×2): qty 1

## 2013-06-26 MED ORDER — LOPERAMIDE HCL 2 MG PO CAPS: 2.0000 mg | ORAL_CAPSULE | ORAL | Status: AC | PRN
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
## 2013-06-26 NOTE — ED Notes (Signed)
Pt has been changed and is currently waiting for security to come wand her and check her belongings.   Pt appears to be very anxious and has been asking if she can go outside and smoke a cigarette. She was advised that she could not go outside and that we are a smoke free facility.
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
## 2013-06-26 NOTE — ED Notes (Signed)
Pt said she wants to leave, RN made aware.

## 2013-06-26 NOTE — ED Notes (Addendum)
Pt belongings:  Jacket Shirt Flip Flops Blue Jeans  Pt's purse and small fuchsia suitcase is being sent home with family. Pt's earrings are also with the belongings going home with family.   Belongings entered by [place], Advertising copywriteremergency technician 1
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
## 2013-06-26 NOTE — ED Notes (Signed)
Pt also reports that she had a episode of palpitations earlier.  Pt instructed to notify staff if it reoccurs

## 2013-06-26 NOTE — Progress Notes (Signed)
Patient did attend the evening speaker AA meeting.  

## 2013-06-26 NOTE — ED Notes (Addendum)
Lacerations cleaned w/ sterile NS sutures CDI,  Ointment applied.  Pt encouraged to use ice pk intermittantly and to keep HOB elevated.   Pt reports that [facility] plastic surgeon and is not sure when the sutures should be removed.  Pt reports no nasal injuries and that she had been instructed to use the ointment on  her head lac's, will inform NP.

## 2013-06-26 NOTE — ED Notes (Signed)
Pt is changing into scrubs. 

## 2013-06-26 NOTE — ED Notes (Signed)
pts urine nearly clear and cool, pt denies putting any other substance in cup
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
## 2013-06-26 NOTE — ED Notes (Signed)
Dr arfeen and jamison into see 

## 2013-06-26 NOTE — BH Assessment (Signed)
Assessment completed. Consulted with Alberteen SamFran Hobson, NP who recommended inpatient treatment. [facility]. Dahlia ClientHannah Muthersbaugh, PA-C has been notified of recommendations.
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
## 2013-06-26 NOTE — ED Notes (Signed)
Ashley Poole [facility] into see

## 2013-06-26 NOTE — ED Notes (Signed)
Up to the bathroom 

## 2013-06-26 NOTE — BH Assessment (Signed)
Per Anne FuKeneisha Herbin, [facility] at [facility], [address] is currently at capacity. Contacted contacted the following facilities for placement:  [facility]- Per Misty StanleyLisa a bed is available. Faxed clinical information. [facility]- Per Eileen StanfordJenna a bed is available. Pt's BAL is too high to be considered at this time.   Harlin RainFord Ellis Ria CommentWarrick Jr, LPC, [facility] Triage Specialist [phone]

## 2013-06-26 NOTE — ED Notes (Signed)
Patient given Unhealthy Thought patterns, AA Meetings for [facility] Guidelines sheets as part of provided Psychoeducational materials.

## 2013-06-26 NOTE — ED Provider Notes (Signed)
Medical screening examination/treatment/procedure(s) were performed by non-physician practitioner and as supervising physician I was immediately available for consultation/collaboration.   EKG Interpretation None        Enid SkeensJoshua M Zavitz, MD 06/26/13 [phone]

## 2013-06-26 NOTE — ED Notes (Signed)
Pt has decided to stay and receive treatment

## 2013-06-27 DIAGNOSIS — F4310 Post-traumatic stress disorder, unspecified: Secondary | ICD-10-CM

## 2013-06-27 DIAGNOSIS — F332 Major depressive disorder, recurrent severe without psychotic features: Secondary | ICD-10-CM

## 2013-06-27 DIAGNOSIS — F1010 Alcohol abuse, uncomplicated: Secondary | ICD-10-CM

## 2013-06-27 MED ORDER — MIRTAZAPINE 7.5 MG PO TABS
7.5000 mg | ORAL_TABLET | Freq: Every day | ORAL | Status: DC
  Administered 2013-06-27 – 2013-06-29 (×3): 7.5 mg via ORAL
  Filled 2013-06-27 (×3): qty 1
  Filled 2013-06-27: qty 14
  Filled 2013-06-27 (×2): qty 1

## 2013-06-27 MED ORDER — IBUPROFEN 600 MG PO TABS
600.0000 mg | ORAL_TABLET | Freq: Three times a day (TID) | ORAL | Status: DC | PRN
  Administered 2013-06-28 – 2013-06-30 (×4): 600 mg via ORAL
  Filled 2013-06-27 (×4): qty 1

## 2013-06-27 NOTE — Progress Notes (Signed)
Patient did attend the evening speaker AA meeting.  
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
## 2013-06-27 NOTE — [facility] Group Notes (Signed)
[facility] Group Notes:  (Clinical Social Work)  06/27/2013  10:00-11:00AM  Summary of Progress/Problems:   The main focus of today's process group was to   identify the patient's current support system and decide on other supports that can be put in place.  The picture on workbook was used to discuss why additional supports are needed.  An emphasis was placed on using counselor, doctor, therapy groups, 12-step groups, and problem-specific support groups to expand supports.   There was also an extensive discussion about what constitutes a healthy support versus an unhealthy support.  The patient expressed full comprehension of the concepts presented, and agreed that there is a need to add more supports.  The patient stated the current supports in place are her roommate, her brother, and her daughter.  Her parents are supportive, although they are in a different state, and her father is more supportive than her mother simply because her mother has aphasia.  She sat silently throughout group, got up several times to get a drink but only talked once toward the end of group and that was to give advice to several women in the group.    Type of Therapy:  Process Group with Motivational Interviewing  Participation Level:  Minimal  Participation Quality:  Attentive  Affect:  Depressed and painful  Cognitive:  Appropriate  Insight:  Improving  Engagement in Therapy: Improving  Modes of Intervention:   Education, Support and Processing, Activity  Ambrose MantleMareida Grossman-Orr, LCSW 06/27/2013, 12:15pm
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
## 2013-06-27 NOTE — B[facility] Group Notes (Signed)
[facility] Group Notes:  (Nursing/MHT/Case Management/Adjunct)  Date:  06/27/2013  Time: 0900 am  Type of Therapy:  Self Inventory Group  Participation Level:  Did Not Attend   Cranford MonBeaudry, Caroline Evans 06/27/2013, 9:52 AM

## 2013-06-27 NOTE — Progress Notes (Signed)
Patient ID: [name], female   DOB: 08/18/1975, 38 y.o.   MRN: [id number] D: Patient's main concern this morning is her level of pain.  She complains of severe pain in her neck, face and head.  Patient is on oxy 5, however, she reports excessive pain.  Informed patient that I would pass it along to nurse practitioner.  Patient has lacerations and stitches on the left side of her face and neck.  She presents with painful and sad affect; her mood is depressed.  She would not rate her depression on her self inventory and states her hopelessness is a 5.  The patient denies any withdrawal symptoms.  Patient did not attend group this morning due to her pain.  She denies any SI/HI/AVH.  Her main goal is to "not drink and get a job and car."  A: Continue to monitor medication management and MD orders.  Safety checks completed every 15 minutes per protocol.  R: Patient is receptive to staff; her behavior is appropriate to situation.

## 2013-06-28 DIAGNOSIS — F1994 Other psychoactive substance use, unspecified with psychoactive substance-induced mood disorder: Secondary | ICD-10-CM

## 2013-06-28 NOTE — Progress Notes (Signed)
Patient ID: Ashley Poole, female   DOB: 03/10/1975, 38 y.o.   MRN: [id number]  D: Patient with c/o anxiety and depression. Pt tearful at times. Pt with med-seeking behaviors, requesting multiple PRN medications throughout the shift. A: Q 25 minute safety checks, encourage staff/peer interaction and group participation. Administer medications as ordered by MD. R: Pt compliant with meds. No distress noted. Pt denies SI.

## 2013-06-28 NOTE — [facility] Group Notes (Signed)
[facility] Planning [facility] Note   06/28/2013 9:42 AM  Participation Quality:  Active with Depressed Mood  Mood/Affect:  Depressed and Flat  Depression Rating:  3   Anxiety Rating:  1  Thoughts of Suicide:  No Will you contract for safety?   Yes  Current AVH:  No  Plan for Discharge/Comments:  Patient reports her desire to [facility] upon discharge.  Transportation Means: Unknown at this time.  Supports: Brother and friend   Paulino DoorICKETT JR, [place]GREGORY C

## 2013-06-28 NOTE — [facility] Suicide Risk Assessment (Signed)
[facility] INPATIENT:  Family/Significant Other Suicide Prevention Education  Suicide Prevention Education:  Education Completed; Hulen LusterBrad Naill-Brother [phone]) has been identified by the patient as the family member/significant other with whom the patient will be residing, and identified as the person(s) who will aid the patient in the event of a mental health crisis (suicidal ideations/suicide attempt).  With written consent from the patient, the family member/significant other has been provided the following suicide prevention education, prior to the and/or following the discharge of the patient.  The suicide prevention education provided includes the following:  Suicide risk factors  Suicide prevention and interventions  National Suicide Hotline telephone number  [facility] assessment telephone number  [facility] Emergency Assistance 911  [facility] and/or Residential Mobile Crisis Unit telephone number  Request made of family/significant other to:  Remove weapons (e.g., guns, rifles, knives), all items previously/currently identified as safety concern.    Remove drugs/medications (over-the-counter, prescriptions, illicit drugs), all items previously/currently identified as a safety concern.  The family member/significant other verbalizes understanding of the suicide prevention education information provided.  The family member/significant other agrees to remove the items of safety concern listed above.  Haskel KhanICKETT JR, GREGORY C 06/28/2013, 12:43 PM

## 2013-06-28 NOTE — Progress Notes (Signed)
Adult Psychoeducational Group Note  Date:  06/28/2013 Time:  10:47 AM  Group Topic/Focus:  Dimensions of Wellness:   The focus of this group is to introduce the topic of wellness and discuss the role each dimension of wellness plays in total health.  Participation Level:  Active  Participation Quality:  Attentive and Sharing  Affect:  Depressed  Cognitive:  Alert and Oriented  Insight: Good and Improving  Engagement in Group:  Limited  Modes of Intervention:  Discussion  Additional Comments: Pt identified two specific areas she needs to work on, financial goals; getting a job, and intellectual: she is almost finished with her masters degree and wants to complete this.  Reynolds BowlClement, Dorothy D 06/28/2013, 10:47 AM

## 2013-06-28 NOTE — Progress Notes (Signed)
Patient ID: Ashley RoseMiranda Poole, female   DOB: 07/27/1975, 38 y.o.   MRN: [id number] She has been up and interacting with peers and staff today and going to groups . She has requested and received prn oxycodone twice today and motrin once. And prn decreased pain. Self inventory: depression o 0, hopelessness 7, withdrawal of some shaking, and she denies SI thoughts.

## 2013-06-28 NOTE — [facility] Group Notes (Signed)
[facility] LCSW Group Therapy  06/28/2013 2:51 PM  Type of Therapy and Topic:  Group Therapy:  Overcoming Obstacles  Participation Level:  Active with Depressed Mood   Description of Group:    In this group patients will be encouraged to explore what they see as obstacles to their own wellness and recovery. They will be guided to discuss their thoughts, feelings, and behaviors related to these obstacles. The group will process together ways to cope with barriers, with attention given to specific choices patients can make. Each patient will be challenged to identify changes they are motivated to make in order to overcome their obstacles. This group will be process-oriented, with patients participating in exploration of their own experiences as well as giving and receiving support and challenge from other group members.  Therapeutic Goals: 1. Patient will identify personal and current obstacles as they relate to admission. 2. Patient will identify barriers that currently interfere with their wellness or overcoming obstacles.  3. Patient will identify feelings, thought process and behaviors related to these barriers. 4. Patient will identify two changes they are willing to make to overcome these obstacles:    Summary of Patient Progress Ashley Poole was observed to exhibit a depressed mood throughout group. She stated that she views her drinking to be her current obstacle. Ashley Poole processed feelings of disappointment, embarrassment, and frustration towards how her alcoholism has prevented her from taking care of herself physically, emotionally, and mentally. Ashley Poole ended group demonstrating progressing insight AEB reporting her first step in overcoming alcoholism to be maintaining sobriety and utilizing positive supports.       Therapeutic Modalities:   Cognitive Behavioral Therapy Solution Focused Therapy Motivational Interviewing Relapse Prevention Therapy   PICKETT JR, GREGORY C 06/28/2013, 2:51  PM
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
## 2013-06-28 NOTE — Progress Notes (Signed)
Adult Psychoeducational Group Note  Date:  06/28/2013 Time:  9:03 PM  Group Topic/Focus:  Wrap-Up Group:   The focus of this group is to help patients review their daily goal of treatment and discuss progress on daily workbooks.  Participation Level:  Active  Participation Quality:  Appropriate and Supportive  Affect:  Defensive  Cognitive:  Alert  Insight: Good  Engagement in Group:  Defensive  Modes of Intervention:  Activity  Additional Comments:  Pt came to group was upset after another PT expressed that he felt that people come here to play games and are leaving here in no better shape then when they came... Pt started to cry and went to bed right after snack. Pt expressed that she does not trust herself to do the right thing.   Ashley Poole, Delton R 06/28/2013, 9:03 PM

## 2013-06-29 NOTE — Progress Notes (Signed)
Patient ID: Ashley Poole, female   DOB: 11/16/1975, 38 y.o.   MRN: [id number] She has been up and about and to groups interacting with peers and staff. Has requested and received prn for arthritis pain in her hands. Self inventory: depression 0, hopelessness 3 down from 7 yesterday. Denies SI thoughts abd withdrawal symptoms.

## 2013-06-29 NOTE — Progress Notes (Signed)
Patient ID: Ashley Poole [place], female   DOB: 06/20/1975, 38 y.o.   MRN: [id number]  Pt resting in bed with eyes closed. No distress noted.

## 2013-06-29 NOTE — Progress Notes (Signed)
Recreation Therapy Notes  Animal-Assisted Activity/Therapy (AAA/T) Program Checklist/Progress Notes Patient Eligibility Criteria Checklist & Daily Group note for Rec Tx Intervention  Date: 06.23.2015 Time: 2:45 Location: 300 Hall Dayroom    AAA/T Program Assumption of Risk Form signed by Patient/ or Parent Legal Guardian yes  Patient is free of allergies or sever asthma yes  Patient reports no fear of animals yes  Patient reports no history of cruelty to animals yes   Patient understands his/her participation is voluntary yes  Patient washes hands before animal contact yes  Patient washes hands after animal contact yes  Behavioral Response: Appropriate   Education: Hand Washing, Appropriate Animal Interaction   Education Outcome: Acknowledges understanding  Clinical Observations/Feedback: Patient interacted appropriately with therapy dog, handler and MHT.   Zella Dewan L Osher Oettinger, LRT/CTRS        Avyukth Bontempo L 06/29/2013 4:54 PM 

## 2013-06-29 NOTE — [facility] Group Notes (Signed)
[facility] LCSW Group Therapy  06/29/2013 1:38 PM  Type of Therapy:  Group Therapy  Participation Level:  Active  Participation Quality:  Attentive  Affect:  Depressed and Flat  Cognitive:  Oriented  Insight:  Improving  Engagement in Therapy:  Improving  Modes of Intervention:  Confrontation, Discussion, Education, Exploration, Problem-solving, Rapport Building, Socialization and Support  Summary of Progress/Problems: MHA Speaker came to talk about his personal journey with substance abuse and addiction. The pt processed ways by which to relate to the speaker. MHA speaker provided handouts and educational information pertaining to groups and services offered by [facility].   Smart, [name] LCSWA 06/29/2013, 1:38 PM

## 2013-06-29 NOTE — Progress Notes (Signed)
Pt attended spiritual care group on grief and loss facilitated by chaplain Burnis KingfisherMatthew Stalnaker. Group opened with brief discussion and psycho-social ed around grief and loss in relationships and in relation to self - identifying life patterns, circumstances, changes that cause losses. Established group norm of speaking from own life experience. Group goal of establishing open and affirming space for members to share loss and experience with grief, normalize grief experience and provide psycho social education and grief support.   Mairely was present throughout group.  She was pulled out of group to speak with care team, but returned.  She did not share in conversation, but was supportive and attentive of others.
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
## 2013-06-29 NOTE — Progress Notes (Signed)
Morning Wellness Group - 0830  The focus of this group is to educate the patient on the purpose and policies of crisis stabilization and provide a format to answer questions about their admission.  The group details unit policies and expectations of patients while admitted.  The patient attended group and was able to verbalize what recovery means to her and what methods she can use to ensure/enhance the recovery process. 

## 2013-06-29 NOTE — Progress Notes (Signed)
[facility] MD Progress Note  06/29/2013 5:31 PM Loleta RoseMiranda Arvin  MRN:  [id number] Subjective:  [name] states she is committed to abstinence. She is afraid of not being able to make it if she was not to go into a rehab program. Still dealing with the headache. Feels overwhelmed. Once she gets out of rehab feels she is going to be able to make it if she stays with her brother who is in recovery and goes to meetings on a regular basis Diagnosis:   DSM5: Schizophrenia Disorders:  none Obsessive-Compulsive Disorders:  none Trauma-Stressor Disorders:  none Substance/Addictive Disorders:  Alcohol Related Disorder - Severe (303.90) Depressive Disorders:  Major Depressive Disorder - Moderate (296.22) Total Time spent with patient: 30 minutes  Axis I: Substance Induced Mood Disorder  ADL's:  Intact  Sleep: Poor  Appetite:  Fair  Suicidal Ideation:  Plan:  denies Intent:  denies Means:  denies Homicidal Ideation:  Plan:  denies Intent:  denies Means:  denies AEB (as evidenced by):  Psychiatric Specialty Exam: Physical Exam  Review of Systems  Constitutional: Negative.   Eyes: Negative.   Respiratory: Negative.   Cardiovascular: Negative.   Gastrointestinal: Negative.   Genitourinary: Negative.   Musculoskeletal: Negative.   Skin: Negative.   Neurological: Positive for headaches.  Endo/Heme/Allergies: Negative.   Psychiatric/Behavioral: Positive for depression and substance abuse. The patient is nervous/anxious.     Blood pressure 129/89, pulse 87, temperature 98.2 F (36.8 C), temperature source Oral, resp. rate 18, height 5\' 3"  (1.6 m), weight 55.792 kg (123 lb), last menstrual period 06/04/2013, SpO2 10.00%.Body mass index is 21.79 kg/(m^2).  General Appearance: Fairly Groomed  Patent attorneyye Contact::  Fair  Speech:  Clear and Coherent  Volume:  Decreased  Mood:  worried, sad  Affect:  Restricted  Thought Process:  Coherent and Goal Directed  Orientation:  Full (Time, Place, and Person)   Thought Content:  symptoms, worries, concerns  Suicidal Thoughts:  No  Homicidal Thoughts:  No  Memory:  Immediate;   Fair Recent;   Fair Remote;   Fair  Judgement:  Fair  Insight:  Present  Psychomotor Activity:  Normal  Concentration:  Fair  Recall:  Fis[employer]Fair  Fund of Knowledge:NA  Language: Fair  Akathisia:  No  Handed:    AIMS (if indicated):     Assets:  Desire for Improvement  Sleep:  Number of Hours: 5.5   Musculoskeletal: Strength & Muscle Tone: within normal limits Gait & Station: normal Patient leans: N/A  Current Medications: Current Facility-Administered Medications  Medication Dose Route Frequency Provider Last Rate Last Dose  . acetaminophen (TYLENOL) tablet 650 mg  650 mg Oral Q6H PRN Nanine MeansJamison Lord, NP      . alum & mag hydroxide-simeth (MAALOX/MYLANTA) 200-200-20 MG/5ML suspension 30 mL  30 mL Oral Q4H PRN Nanine MeansJamison Lord, NP      . Melene Muller[START ON 06/30/2013] chlordiazePOXIDE (LIBRIUM) capsule 25 mg  25 mg Oral Daily Nanine MeansJamison Lord, NP      . cyclobenzaprine (FLEXERIL) tablet 5 mg  5 mg Oral TID PRN Nanine MeansJamison Lord, NP   5 mg at 06/29/13 1634  . docusate sodium (COLACE) capsule 100 mg  100 mg Oral BID Nanine MeansJamison Lord, NP   100 mg at 06/29/13 1634  . ibuprofen (ADVIL,MOTRIN) tablet 600 mg  600 mg Oral Q8H PRN Truman Haywardakia S Starkes, FNP   600 mg at 06/29/13 1634  . magnesium hydroxide (MILK OF MAGNESIA) suspension 30 mL  30 mL Oral Daily PRN Nanine MeansJamison Lord, NP      .  mirtazapine (REMERON) tablet 7.5 mg  7.5 mg Oral QHS Truman Haywardakia S Starkes, FNP   7.5 mg at 06/28/13 2105  . multivitamin with minerals tablet 1 tablet  1 tablet Oral Daily Nanine MeansJamison Lord, NP   1 tablet at 06/29/13 0800  . mupirocin ointment (BACTROBAN) 2 % 1 application  1 application Topical TID Nanine MeansJamison Lord, NP   1 application at 06/29/13 1700  . nicotine (NICODERM CQ - dosed in mg/24 hours) patch 21 mg  21 mg Transdermal Daily Nanine MeansJamison Lord, NP   21 mg at 06/29/13 0800  . oxyCODONE (Oxy IR/ROXICODONE) immediate release tablet 5 mg  5  mg Oral Q4H PRN Nanine MeansJamison Lord, NP   5 mg at 06/29/13 1634  . thiamine (VITAMIN B-1) tablet 100 mg  100 mg Oral Daily Nanine MeansJamison Lord, NP   100 mg at 06/29/13 0800  . zolpidem (AMBIEN) tablet 5 mg  5 mg Oral QHS PRN Nanine MeansJamison Lord, NP   5 mg at 06/28/13 2105    Lab Results: No results found for this or any previous visit (from the past 48 hour(s)).  Physical Findings: AIMS: Facial and Oral Movements Muscles of Facial Expression: None, normal Lips and Perioral Area: None, normal Jaw: None, normal Tongue: None, normal,Extremity Movements Upper (arms, wrists, hands, fingers): None, normal Lower (legs, knees, ankles, toes): None, normal, Trunk Movements Neck, shoulders, hips: None, normal, Overall Severity Severity of abnormal movements (highest score from questions above): None, normal Incapacitation due to abnormal movements: None, normal Patient's awareness of abnormal movements (rate only patient's report): No Awareness, Dental Status Current problems with teeth and/or dentures?: No Does patient usually wear dentures?: No  CIWA:  CIWA-Ar Total: 0 COWS:     Treatment Plan Summary: Daily contact with patient to assess and evaluate symptoms and progress in treatment Medication management  Plan: Supportive approach/coping skills/relapse prevention           Optimize response to psychotropics  Medical Decision Making Problem Points:  Review of psycho-social stressors (1) Data Points:  Review of medication regiment & side effects (2)  I certify that inpatient services furnished can reasonably be expected to improve the patient's condition.   LUGO,IRVING A 06/29/2013, 5:31 PM

## 2013-06-30 DIAGNOSIS — F1020 Alcohol dependence, uncomplicated: Principal | ICD-10-CM

## 2013-06-30 DIAGNOSIS — F329 Major depressive disorder, single episode, unspecified: Secondary | ICD-10-CM

## 2013-06-30 MED ORDER — OXYCODONE HCL 5 MG PO TABS: 5.0000 mg | ORAL_TABLET | ORAL | Status: DC | PRN

## 2013-06-30 MED ORDER — POLYETHYLENE GLYCOL 3350 17 G PO PACK: 17.0000 g | PACK | Freq: Every day | ORAL | Status: DC | PRN

## 2013-06-30 MED ORDER — MIRTAZAPINE 7.5 MG PO TABS: 7.5000 mg | ORAL_TABLET | Freq: Every day | ORAL | Status: DC

## 2013-06-30 MED ORDER — MIRTAZAPINE 15 MG PO TABS
15.0000 mg | ORAL_TABLET | Freq: Every day | ORAL | Status: DC
  Administered 2013-06-30: 15 mg via ORAL
  Filled 2013-06-30: qty 1

## 2013-06-30 MED ORDER — POLYETHYLENE GLYCOL 3350 17 G PO PACK
17.0000 g | PACK | Freq: Every day | ORAL | Status: DC
  Filled 2013-06-30: qty 14

## 2013-06-30 MED ORDER — MUPIROCIN 2 % EX OINT: 1.0000 "application " | TOPICAL_OINTMENT | Freq: Three times a day (TID) | CUTANEOUS | Status: DC

## 2013-06-30 MED ORDER — DOCUSATE SODIUM 100 MG PO CAPS: 100.0000 mg | ORAL_CAPSULE | Freq: Two times a day (BID) | ORAL | Status: DC

## 2013-06-30 MED ORDER — MIRTAZAPINE 15 MG PO TABS: 15.0000 mg | ORAL_TABLET | Freq: Every day | ORAL | Status: DC

## 2013-06-30 MED ORDER — ZOLPIDEM TARTRATE 5 MG PO TABS: 5.0000 mg | ORAL_TABLET | Freq: Every evening | ORAL | Status: DC | PRN

## 2013-06-30 NOTE — Progress Notes (Signed)
[facility]  06/30/2013 5:17 PM Ashley RoseMiranda Poole  MRN:  [id number] Subjective:  [facility]ole [facility]. She states she did not sleep as well last night, maybe because of worrying. She states she really wants to get her life back together. She is aware of the possible consequences of a relapse. She states that she knows if she was to walk out of here today, she would be drinking this afternoon. 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   Eyes: Negative.   Respiratory: Negative.   Cardiovascular: Negative.   Gastrointestinal: Negative.   Genitourinary: Negative.   Musculoskeletal: Negative.   Skin: Negative.   Neurological: Positive for headaches.  Endo/Heme/Allergies: Negative.   Psychiatric/Behavioral: Positive for depression and substance abuse. The patient has insomnia.     Blood pressure 104/75, pulse 121, temperature 97.8 F (36.6 C), temperature source Oral, resp. rate 16, height 5\' 3"  (1.6 m), weight 55.792 kg (123 lb), last menstrual period 06/04/2013, SpO2 10.00%.Body mass index is 21.79 kg/(m^2).  General Appearance: Fairly Groomed  Patent attorneyye Contact::  Fair  Speech:  Clear and Coherent  Volume:  Decreased  Mood:  worried  Affect:  Restricted  Thought Process:  Coherent and Goal Directed  Orientation:   Full (Time, Place, and Person)  Thought Content:  symptoms, worries, concerns  Suicidal Thoughts:  No  Homicidal Thoughts:  No  Memory:  Immediate;   Fair Recent;   Fair Remote;   Fair  Judgement:  Fair  Insight:  Present  Psychomotor Activity:  Normal  Concentration:  Fair  Recall:  F[employer]Fair  Fund of Knowledge:NA  Language: Fair  Akathisia:  No  Handed:    AIMS (if indicated):     Assets:  Desire for Improvement  Sleep:  Number of Hours: 4.75   Musculoskeletal: Strength & Muscle Tone: within normal limits Gait & Station: normal Patient leans: N/A  Current Medications: Current Facility-Administered Medications  Medication Dose Route Frequency Alayzha An Last Rate Last Dose  . acetaminophen (TYLENOL) tablet 650 mg  650 mg Oral Q6H PRN Nanine MeansJamison Lord, NP      . alum & mag hydroxide-simeth (MAALOX/MYLANTA) 200-200-20 MG/5ML suspension 30 mL  30 mL Oral Q4H PRN Nanine MeansJamison Lord, NP      . cyclobenzaprine (FLEXERIL) tablet 5 mg  5 mg Oral TID PRN Nanine MeansJamison Lord, NP   5 mg at 06/30/13 [id number]  . docusate sodium (COLACE) capsule 100 mg  100 mg Oral BID Nanine MeansJamison Lord, NP   100 mg at 06/30/13 1700  . ibuprofen (ADVIL,MOTRIN) tablet 600 mg  600 mg Oral Q8H PRN Truman Haywardakia S Starkes, FNP   600 mg at 06/30/13 [id number]  . magnesium hydroxide (MILK OF MAGNESIA) suspension 30 mL  30 mL Oral Daily PRN Nanine MeansJamison Lord, NP   30 mL at 06/30/13 1702  . mirtazapine (REMERON) tablet 15 mg  15 mg Oral QHS Rachael FeeIrving A Lugo,  MD      . multivitamin with minerals tablet 1 tablet  1 tablet Oral Daily Nanine MeansJamison Lord, NP   1 tablet at 06/30/13 0816  . mupirocin ointment (BACTROBAN) 2 % 1 application  1 application Topical TID Nanine MeansJamison Lord, NP   1 application at 06/30/13 1701  . nicotine (NICODERM CQ - dosed in mg/24 hours) patch 21 mg  21 mg Transdermal Daily Nanine MeansJamison Lord, NP   21 mg at 06/30/13 0816  . oxyCODONE (Oxy IR/ROXICODONE) immediate release tablet 5 mg  5 mg Oral Q4H PRN Nanine MeansJamison Lord, NP   5 mg at 06/30/13 1703  . thiamine (VITAMIN  B-1) tablet 100 mg  100 mg Oral Daily Nanine MeansJamison Lord, NP   100 mg at 06/30/13 0816  . zolpidem (AMBIEN) tablet 5 mg  5 mg Oral QHS PRN Nanine MeansJamison Lord, NP   5 mg at 06/28/13 2105    Lab Results: No results found for this or any previous visit (from the past 48 hour(s)).  Physical Findings: AIMS: Facial and Oral Movements Muscles of Facial Expression: None, normal Lips and Perioral Area: None, normal Jaw: None, normal Tongue: None, normal,Extremity Movements Upper (arms, wrists, hands, fingers): None, normal Lower (legs, knees, ankles, toes): None, normal, Trunk Movements Neck, shoulders, hips: None, normal, Overall Severity Severity of abnormal movements (highest score from questions above): None, normal Incapacitation due to abnormal movements: None, normal Patient's awareness of abnormal movements (rate only patient's report): No Awareness, Dental Status Current problems with teeth and/or dentures?: No Does patient usually wear dentures?: No  CIWA:  CIWA-Ar Total: 6 COWS:     Treatment Plan Summary: Daily contact with patient to assess and evaluate symptoms and progress in treatment Medication management  Plan: Supportive approach/coping skills/relapse prevention           Will increase the Remeron to 15 mg HS           [facility]  Medical Decision Making Problem Points:  Review of psycho-social stressors (1) Data Points:  Review of new medications or change in dosage (2)  I certify that inpatient services furnished can reasonably be expected to improve the patient's condition.   LUGO,IRVING A 06/30/2013, 5:17 PM
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
## 2013-06-30 NOTE — Progress Notes (Signed)
Attended group 
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
## 2013-06-30 NOTE — Progress Notes (Signed)
[facility]  Will you be returning to the same living situation after discharge: No. admission scheduled for [facility] tomorrow AM.  At discharge, do you have transportation home?:Yes,  friend taking her directly to [facility]. She will be picked up at 6am in order to have time to get home and pack prior to 8am admission. (thursday 07/01/13 d/c at 6AM) Do you have the ability to pay for your medications:Yes,  mental health  Release of information consent forms completed and submitted to Medical Records by CSW.  Patient to Follow up at: Follow-up Information   [facility]. (Arrive at 8am for admission. Make sure to bring ID, clothing, and medications. )    Contact information:   [address] [place], [place] [address] Phone: [phone] Fax: [phone]      [facility]. (Walk in between 8am-12pm for assessment for services/hospital followup.)    Contact information:   [address], [place] [address] Phone: [phone] Fax: [phone]      Patient denies SI/HI:   Yes,  during group/self report.     Safety Planning and Suicide Prevention discussed:  Yes,  SPE completed with pt's brother. SPI pamphlet provided to pt to share with her support network.   Smart, [name] LCSWA  06/30/2013, 11:47 AM

## 2013-06-30 NOTE — B[facility] Group Notes (Signed)
[facility] LCSW Group Therapy  06/30/2013 3:35 PM  Type of Therapy:  Group Therapy  Participation Level:  Active  Participation Quality:  Attentive  Affect:  Appropriate  Cognitive:  Alert and Oriented  Insight:  Engaged  Engagement in Therapy:  Engaged  Modes of Intervention:  Confrontation, Discussion, Education, Exploration, Problem-solving, Rapport Building, Socialization and Support  Summary of Progress/Problems: Emotion Regulation: This group focused on both positive and negative emotion identification and allowed group members to process ways to identify feelings, regulate negative emotions, and find healthy ways to manage internal/external emotions. Group members were asked to reflect on a time when their reaction to an emotion led to a negative outcome and explored how alternative responses using emotion regulation would have benefited them. Group members were also asked to discuss a time when emotion regulation was utilized when a negative emotion was experienced. Ashley Poole was attentive and engaged throughout today's therapy group. She shared that she struggles with depression. [name] shows progress in the group setting and improving insight AEB her ability to identify how depression feels both physically and mentally. She was able to process how finding healthy coping skills to assist with emotion regulation (meditation, reading, playing with my dogs) can help her reduce vulnerability to depression and avoid turning to drugs/alcohol.    Smart, [name] LCSWA  06/30/2013, 3:35 PM
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
## 2013-07-05 NOTE — Progress Notes (Signed)
Patient Discharge Instructions:  After Visit Summary (AVS):   Faxed to:  07/05/13 Discharge Summary Note:   Faxed to:  07/05/13 Psychiatric Admission Assessment Note:   Faxed to:  07/05/13 Suicide Risk Assessment - Discharge Assessment:   Faxed to:  07/05/13 Faxed/Sent to the Next Level Care provider:  07/05/13 [facility]axed to [facility] @ [phone] [facility] to [facility] @ [phone]  Jerelene ReddenSheena E [place], 07/05/2013, 3:15 PM

## 2013-08-17 ENCOUNTER — Encounter (HOSPITAL_BASED_OUTPATIENT_CLINIC_OR_DEPARTMENT_OTHER): Payer: Self-pay | Admitting: Emergency Medicine

## 2013-08-17 ENCOUNTER — Emergency Department (HOSPITAL_BASED_OUTPATIENT_CLINIC_OR_DEPARTMENT_OTHER)
Admission: EM | Admit: 2013-08-17 | Discharge: 2013-08-17 | Disposition: A | Discharge status: Home / Self Care | Payer: PRIVATE HEALTH INSURANCE | Attending: Emergency Medicine | Admitting: Emergency Medicine

## 2013-08-17 DIAGNOSIS — K089 Disorder of teeth and supporting structures, unspecified: Secondary | ICD-10-CM | POA: Insufficient documentation

## 2013-08-17 DIAGNOSIS — F17200 Nicotine dependence, unspecified, uncomplicated: Secondary | ICD-10-CM | POA: Insufficient documentation

## 2013-08-17 DIAGNOSIS — K0889 Other specified disorders of teeth and supporting structures: Secondary | ICD-10-CM

## 2013-08-17 DIAGNOSIS — Z8781 Personal history of (healed) traumatic fracture: Secondary | ICD-10-CM | POA: Insufficient documentation

## 2013-08-17 DIAGNOSIS — Z8709 Personal history of other diseases of the respiratory system: Secondary | ICD-10-CM | POA: Insufficient documentation

## 2013-08-17 DIAGNOSIS — K0381 Cracked tooth: Secondary | ICD-10-CM | POA: Insufficient documentation

## 2013-08-17 DIAGNOSIS — Z79899 Other long term (current) drug therapy: Secondary | ICD-10-CM | POA: Insufficient documentation

## 2013-08-17 MED ORDER — AMOXICILLIN 500 MG PO CAPS: 500.0000 mg | ORAL_CAPSULE | Freq: Three times a day (TID) | ORAL | Status: AC

## 2013-08-17 MED ORDER — IBUPROFEN 800 MG PO TABS: 800.0000 mg | ORAL_TABLET | Freq: Three times a day (TID) | ORAL | Status: DC
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
## 2013-08-17 NOTE — Discharge Instructions (Signed)

## 2013-08-17 NOTE — ED Notes (Signed)
Left upper tooth ache x months-[facility] for ETOH x 45 days

## 2013-08-19 NOTE — ED Provider Notes (Signed)
Medical screening examination/treatment/procedure(s) were performed by non-physician practitioner and as supervising physician I was immediately available for consultation/collaboration.  Ronnell Makarewicz J. Ilhan Madan, MD 08/19/13 1747 

## 2014-08-10 ENCOUNTER — Emergency Department (HOSPITAL_BASED_OUTPATIENT_CLINIC_OR_DEPARTMENT_OTHER)
Admission: EM | Admit: 2014-08-10 | Discharge: 2014-08-10 | Disposition: A | Discharge status: Home / Self Care | Payer: Self-pay | Attending: Emergency Medicine | Admitting: Emergency Medicine

## 2014-08-10 ENCOUNTER — Encounter (HOSPITAL_BASED_OUTPATIENT_CLINIC_OR_DEPARTMENT_OTHER): Payer: Self-pay | Admitting: *Deleted

## 2014-08-10 DIAGNOSIS — Y9289 Other specified places as the place of occurrence of the external cause: Secondary | ICD-10-CM | POA: Insufficient documentation

## 2014-08-10 DIAGNOSIS — M545 Low back pain, unspecified: Secondary | ICD-10-CM

## 2014-08-10 DIAGNOSIS — Z8709 Personal history of other diseases of the respiratory system: Secondary | ICD-10-CM | POA: Insufficient documentation

## 2014-08-10 DIAGNOSIS — Z79899 Other long term (current) drug therapy: Secondary | ICD-10-CM | POA: Insufficient documentation

## 2014-08-10 DIAGNOSIS — W1839XA Other fall on same level, initial encounter: Secondary | ICD-10-CM | POA: Insufficient documentation

## 2014-08-10 DIAGNOSIS — Z7982 Long term (current) use of aspirin: Secondary | ICD-10-CM | POA: Insufficient documentation

## 2014-08-10 DIAGNOSIS — Y998 Other external cause status: Secondary | ICD-10-CM | POA: Insufficient documentation

## 2014-08-10 DIAGNOSIS — S3992XA Unspecified injury of lower back, initial encounter: Secondary | ICD-10-CM | POA: Insufficient documentation

## 2014-08-10 DIAGNOSIS — Y9389 Activity, other specified: Secondary | ICD-10-CM | POA: Insufficient documentation

## 2014-08-10 DIAGNOSIS — Z720 Tobacco use: Secondary | ICD-10-CM | POA: Insufficient documentation

## 2014-08-10 HISTORY — DX: Alcohol abuse, in remission: F10.11

## 2014-08-10 MED ORDER — KETOROLAC TROMETHAMINE 60 MG/2ML IM SOLN
60.0000 mg | Freq: Once | INTRAMUSCULAR | Status: AC
  Administered 2014-08-10: 60 mg via INTRAMUSCULAR
  Filled 2014-08-10: qty 2

## 2014-08-10 MED ORDER — CYCLOBENZAPRINE HCL 10 MG PO TABS: 10.0000 mg | ORAL_TABLET | Freq: Two times a day (BID) | ORAL | Status: DC | PRN

## 2014-08-10 MED ORDER — NAPROXEN 375 MG PO TABS: 375.0000 mg | ORAL_TABLET | Freq: Two times a day (BID) | ORAL | Status: DC
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
## 2014-08-10 NOTE — ED Notes (Signed)
Patient states she woke up this morning and when getting out of bed, her back "gave out" and she fell.  C/O pain in the bilateral lower back, denies radiation into her buttocks or legs. History of same approximately six years ago.

## 2014-08-10 NOTE — Discharge Instructions (Signed)
Back Pain, Adult Low back pain is very common. About 1 in 5 people have back pain.The cause of low back pain is rarely dangerous. The pain often gets better over time.About half of people with a sudden onset of back pain feel better in just 2 weeks. About 8 in 10 people feel better by 6 weeks.  CAUSES Some common causes of back pain include:  Strain of the muscles or ligaments supporting the spine.  Wear and tear (degeneration) of the spinal discs.  Arthritis.  Direct injury to the back. DIAGNOSIS Most of the time, the direct cause of low back pain is not known.However, back pain can be treated effectively even when the exact cause of the pain is unknown.Answering your caregiver's questions about your overall health and symptoms is one of the most accurate ways to make sure the cause of your pain is not dangerous. If your caregiver needs more information, he or she may order lab work or imaging tests (X-rays or MRIs).However, even if imaging tests show changes in your back, this usually does not require surgery. HOME CARE INSTRUCTIONS For many people, back pain returns.Since low back pain is rarely dangerous, it is often a condition that people can learn to manageon their own.   Remain active. It is stressful on the back to sit or stand in one place. Do not sit, drive, or stand in one place for more than 30 minutes at a time. Take short walks on level surfaces as soon as pain allows.Try to increase the length of time you walk each day.  Do not stay in bed.Resting more than 1 or 2 days can delay your recovery.  Do not avoid exercise or work.Your body is made to move.It is not dangerous to be active, even though your back may hurt.Your back will likely heal faster if you return to being active before your pain is gone.  Pay attention to your body when you bend and lift. Many people have less discomfortwhen lifting if they bend their knees, keep the load close to their bodies,and  avoid twisting. Often, the most comfortable positions are those that put less stress on your recovering back.  Find a comfortable position to sleep. Use a firm mattress and lie on your side with your knees slightly bent. If you lie on your back, put a pillow under your knees.  Only take over-the-counter or prescription medicines as directed by your caregiver. Over-the-counter medicines to reduce pain and inflammation are often the most helpful.Your caregiver may prescribe muscle relaxant drugs.These medicines help dull your pain so you can more quickly return to your normal activities and healthy exercise.  Put ice on the injured area.  Put ice in a plastic bag.  Place a towel between your skin and the bag.  Leave the ice on for 15-20 minutes, 03-04 times a day for the first 2 to 3 days. After that, ice and heat may be alternated to reduce pain and spasms.  Ask your caregiver about trying back exercises and gentle massage. This may be of some benefit.  Avoid feeling anxious or stressed.Stress increases muscle tension and can worsen back pain.It is important to recognize when you are anxious or stressed and learn ways to manage it.Exercise is a great option. SEEK MEDICAL CARE IF:  You have pain that is not relieved with rest or medicine.  You have pain that does not improve in 1 week.  You have new symptoms.  You are generally not feeling well. SEEK   IMMEDIATE MEDICAL CARE IF:   You have pain that radiates from your back into your legs.  You develop new bowel or bladder control problems.  You have unusual weakness or numbness in your arms or legs.  You develop nausea or vomiting.  You develop abdominal pain.  You feel faint. Document Released: 12/24/2004 Document Revised: 06/25/2011 Document Reviewed: 04/27/2013 ExitCare Patient Information 2015 [employer]. This information is not intended to replace advice given to you by your health care provider. Make sure you  discuss any questions you have with your health care provider.  

## 2015-04-24 ENCOUNTER — Encounter (HOSPITAL_BASED_OUTPATIENT_CLINIC_OR_DEPARTMENT_OTHER): Payer: Self-pay | Admitting: Emergency Medicine

## 2015-04-24 ENCOUNTER — Emergency Department (HOSPITAL_BASED_OUTPATIENT_CLINIC_OR_DEPARTMENT_OTHER): Payer: Self-pay

## 2015-04-24 ENCOUNTER — Emergency Department (HOSPITAL_BASED_OUTPATIENT_CLINIC_OR_DEPARTMENT_OTHER)
Admission: EM | Admit: 2015-04-24 | Discharge: 2015-04-24 | Disposition: A | Discharge status: Home / Self Care | Payer: Self-pay | Attending: Emergency Medicine | Admitting: Emergency Medicine

## 2015-04-24 DIAGNOSIS — Z87828 Personal history of other (healed) physical injury and trauma: Secondary | ICD-10-CM | POA: Insufficient documentation

## 2015-04-24 DIAGNOSIS — F17210 Nicotine dependence, cigarettes, uncomplicated: Secondary | ICD-10-CM | POA: Insufficient documentation

## 2015-04-24 DIAGNOSIS — R079 Chest pain, unspecified: Secondary | ICD-10-CM | POA: Insufficient documentation

## 2015-04-24 DIAGNOSIS — Z8709 Personal history of other diseases of the respiratory system: Secondary | ICD-10-CM | POA: Insufficient documentation

## 2015-04-24 LAB — COMPREHENSIVE METABOLIC PANEL
ALK PHOS: 54 U/L (ref 38–126)
ALT: 58 U/L — ABNORMAL HIGH (ref 14–54)
ANION GAP: 9 (ref 5–15)
AST: 64 U/L — ABNORMAL HIGH (ref 15–41)
Albumin: 4.2 g/dL (ref 3.5–5.0)
BILIRUBIN TOTAL: 0.3 mg/dL (ref 0.3–1.2)
BUN: 7 mg/dL (ref 6–20)
CALCIUM: 8.5 mg/dL — AB (ref 8.9–10.3)
CO2: 22 mmol/L (ref 22–32)
Chloride: 109 mmol/L (ref 101–111)
Creatinine, Ser: 0.61 mg/dL (ref 0.44–1.00)
GFR calc Af Amer: >60 mL/min (ref >60)
GFR calc non Af Amer: >60 mL/min (ref >60)
Glucose, Bld: 106 mg/dL — ABNORMAL HIGH (ref 65–99)
Potassium: 3.8 mmol/L (ref 3.5–5.1)
SODIUM: 140 mmol/L (ref 135–145)
TOTAL PROTEIN: 7.7 g/dL (ref 6.5–8.1)

## 2015-04-24 LAB — URINALYSIS, ROUTINE W REFLEX MICROSCOPIC
BILIRUBIN URINE: NEGATIVE
Glucose, UA: NEGATIVE mg/dL
Hgb urine dipstick: NEGATIVE
KETONES UR: NEGATIVE mg/dL
Leukocytes, UA: NEGATIVE
NITRITE: NEGATIVE
Protein, ur: NEGATIVE mg/dL
Specific Gravity, Urine: 1.003 — ABNORMAL LOW (ref 1.005–1.030)
pH: 5.5 (ref 5.0–8.0)

## 2015-04-24 LAB — CBC
HCT: 40.2 % (ref 36.0–46.0)
HEMOGLOBIN: 13.9 g/dL (ref 12.0–15.0)
MCH: 34.9 pg — AB (ref 26.0–34.0)
MCHC: 34.6 g/dL (ref 30.0–36.0)
MCV: 101 fL — ABNORMAL HIGH (ref 78.0–100.0)
Platelets: 180 10*3/uL (ref 150–400)
RBC: 3.98 MIL/uL (ref 3.87–5.11)
RDW: 12.1 % (ref 11.5–15.5)
WBC: 4 10*3/uL (ref 4.0–10.5)

## 2015-04-24 LAB — TROPONIN I: Troponin I: <0.03 ng/mL (ref <0.031)

## 2015-04-24 LAB — D-DIMER, QUANTITATIVE (NOT AT ARMC): D DIMER QUANT: 0.33 ug{FEU}/mL (ref 0.00–0.50)

## 2015-04-24 MED ORDER — ASPIRIN 81 MG PO CHEW
324.0000 mg | CHEWABLE_TABLET | Freq: Once | ORAL | Status: AC
  Administered 2015-04-24: 324 mg via ORAL
  Filled 2015-04-24: qty 4

## 2015-04-24 MED ORDER — KETOROLAC TROMETHAMINE 30 MG/ML IJ SOLN
30.0000 mg | Freq: Once | INTRAMUSCULAR | Status: AC
  Administered 2015-04-24: 30 mg via INTRAVENOUS
  Filled 2015-04-24: qty 1

## 2015-04-24 NOTE — Discharge Instructions (Signed)
Ibuprofen 600 mg 3 times dailys for the next 5 days.  Rest.  Follow-up with your primary Dr. if not improving in the next week, and return to the ER if symptoms significantly worsen or change.   Nonspecific Chest Pain  Chest pain can be caused by many different conditions. There is always a chance that your pain could be related to something serious, such as a heart attack or a blood clot in your lungs. Chest pain can also be caused by conditions that are not life-threatening. If you have chest pain, it is very important to follow up with your health care provider. CAUSES  Chest pain can be caused by:  Heartburn.  Pneumonia or bronchitis.  Anxiety or stress.  Inflammation around your heart (pericarditis) or lung (pleuritis or pleurisy).  A blood clot in your lung.  A collapsed lung (pneumothorax). It can develop suddenly on its own (spontaneous pneumothorax) or from trauma to the chest.  Shingles infection (varicella-zoster virus).  Heart attack.  Damage to the bones, muscles, and cartilage that make up your chest wall. This can include:  Bruised bones due to injury.  Strained muscles or cartilage due to frequent or repeated coughing or overwork.  Fracture to one or more ribs.  Sore cartilage due to inflammation (costochondritis). RISK FACTORS  Risk factors for chest pain may include:  Activities that increase your risk for trauma or injury to your chest.  Respiratory infections or conditions that cause frequent coughing.  Medical conditions or overeating that can cause heartburn.  Heart disease or family history of heart disease.  Conditions or health behaviors that increase your risk of developing a blood clot.  Having had chicken pox (varicella zoster). SIGNS AND SYMPTOMS Chest pain can feel like:  Burning or tingling on the surface of your chest or deep in your chest.  Crushing, pressure, aching, or squeezing pain.  Dull or sharp pain that is worse when  you move, cough, or take a deep breath.  Pain that is also felt in your back, neck, shoulder, or arm, or pain that spreads to any of these areas. Your chest pain may come and go, or it may stay constant. DIAGNOSIS Lab tests or other studies may be needed to find the cause of your pain. Your health care provider may have you take a test called an ambulatory ECG (electrocardiogram). An ECG records your heartbeat patterns at the time the test is performed. You may also have other tests, such as:  Transthoracic echocardiogram (TTE). During echocardiography, sound waves are used to create a picture of all of the heart structures and to look at how blood flows through your heart.  Transesophageal echocardiogram (TEE).This is a more advanced imaging test that obtains images from inside your body. It allows your health care provider to see your heart in finer detail.  Cardiac monitoring. This allows your health care provider to monitor your heart rate and rhythm in real time.  Holter monitor. This is a portable device that records your heartbeat and can help to diagnose abnormal heartbeats. It allows your health care provider to track your heart activity for several days, if needed.  Stress tests. These can be done through exercise or by taking medicine that makes your heart beat more quickly.  Blood tests.  Imaging tests. TREATMENT  Your treatment depends on what is causing your chest pain. Treatment may include:  Medicines. These may include:  Acid blockers for heartburn.  Anti-inflammatory medicine.  Pain medicine for inflammatory conditions.  Antibiotic medicine, if an infection is present.  Medicines to dissolve blood clots.  Medicines to treat coronary artery disease.  Supportive care for conditions that do not require medicines. This may include:  Resting.  Applying heat or cold packs to injured areas.  Limiting activities until pain decreases. HOME CARE INSTRUCTIONS  If  you were prescribed an antibiotic medicine, finish it all even if you start to feel better.  Avoid any activities that bring on chest pain.  Do not use any tobacco products, including cigarettes, chewing tobacco, or electronic cigarettes. If you need help quitting, ask your health care provider.  Do not drink alcohol.  Take medicines only as directed by your health care provider.  Keep all follow-up visits as directed by your health care provider. This is important. This includes any further testing if your chest pain does not go away.  If heartburn is the cause for your chest pain, you may be told to keep your head raised (elevated) while sleeping. This reduces the chance that acid will go from your stomach into your esophagus.  Make lifestyle changes as directed by your health care provider. These may include:  Getting regular exercise. Ask your health care provider to suggest some activities that are safe for you.  Eating a heart-healthy diet. A registered dietitian can help you to learn healthy eating options.  Maintaining a healthy weight.  Managing diabetes, if necessary.  Reducing stress. SEEK MEDICAL CARE IF:  Your chest pain does not go away after treatment.  You have a rash with blisters on your chest.  You have a fever. SEEK IMMEDIATE MEDICAL CARE IF:   Your chest pain is worse.  You have an increasing cough, or you cough up blood.  You have severe abdominal pain.  You have severe weakness.  You faint.  You have chills.  You have sudden, unexplained chest discomfort.  You have sudden, unexplained discomfort in your arms, back, neck, or jaw.  You have shortness of breath at any time.  You suddenly start to sweat, or your skin gets clammy.  You feel nauseous or you vomit.  You suddenly feel light-headed or dizzy.  Your heart begins to beat quickly, or it feels like it is skipping beats. These symptoms may represent a serious problem that is an  emergency. Do not wait to see if the symptoms will go away. Get medical help right away. Call your local emergency services (911 in the [place] Do not drive yourself to the hospital.   This information is not intended to replace advice given to you by your health care provider. Make sure you discuss any questions you have with your health care provider.   Document Released: 10/03/2004 Document Revised: 01/14/2014 Document Reviewed: 07/30/2013 Elsevier Interactive Patient Education [employer]

## 2015-04-24 NOTE — ED Notes (Signed)
Pt with left sided chest pain with radiation to left arm, left neck since, pt states "achy"pain comes and goes, nausea has resolved,

## 2015-04-24 NOTE — ED Notes (Signed)
MD at bedside. 
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
## 2017-07-26 IMAGING — DX DG CHEST 2V
2 series · 2 of 2 positions shown · non-contrast
Comparison: None.

CLINICAL DATA: Chest pain since [REDACTED].

EXAM:
CHEST  2 VIEW

[chest pa]
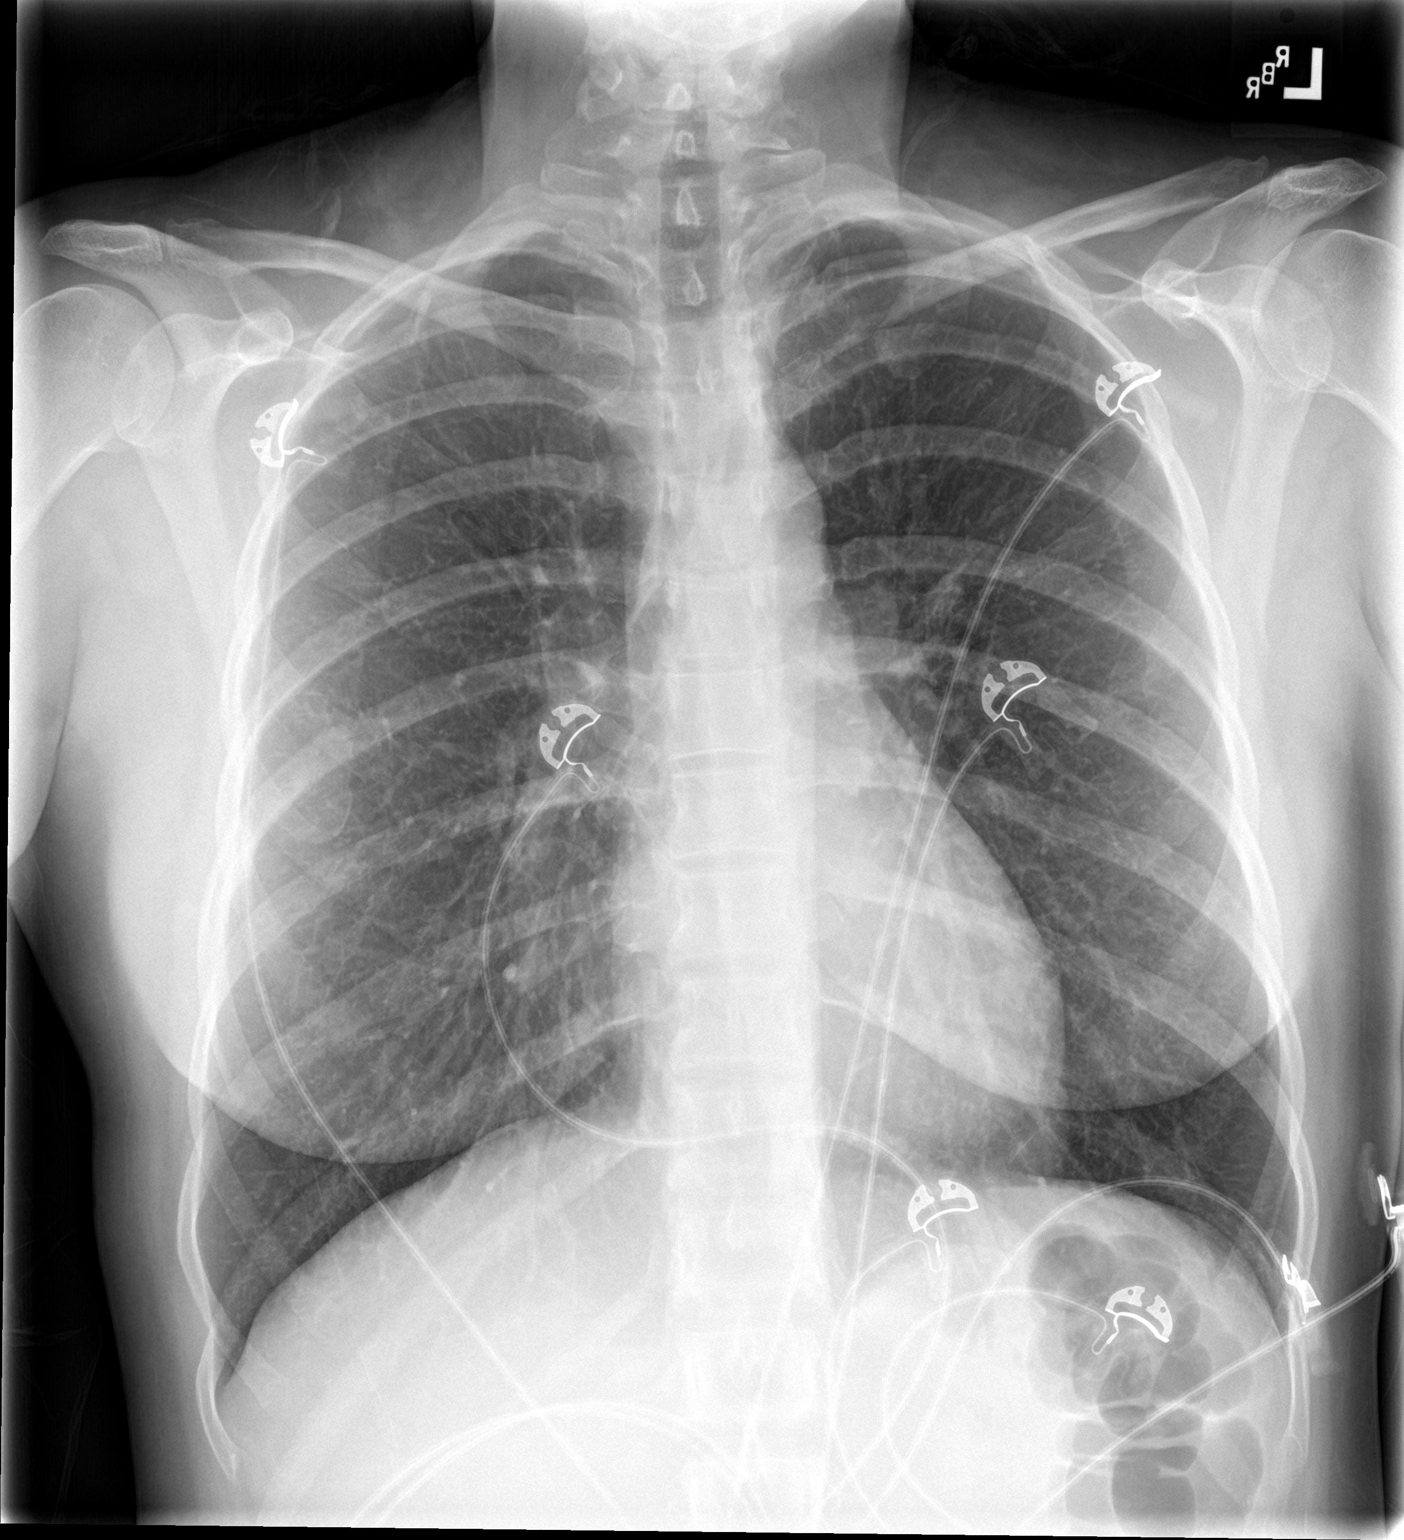

[chest lat]
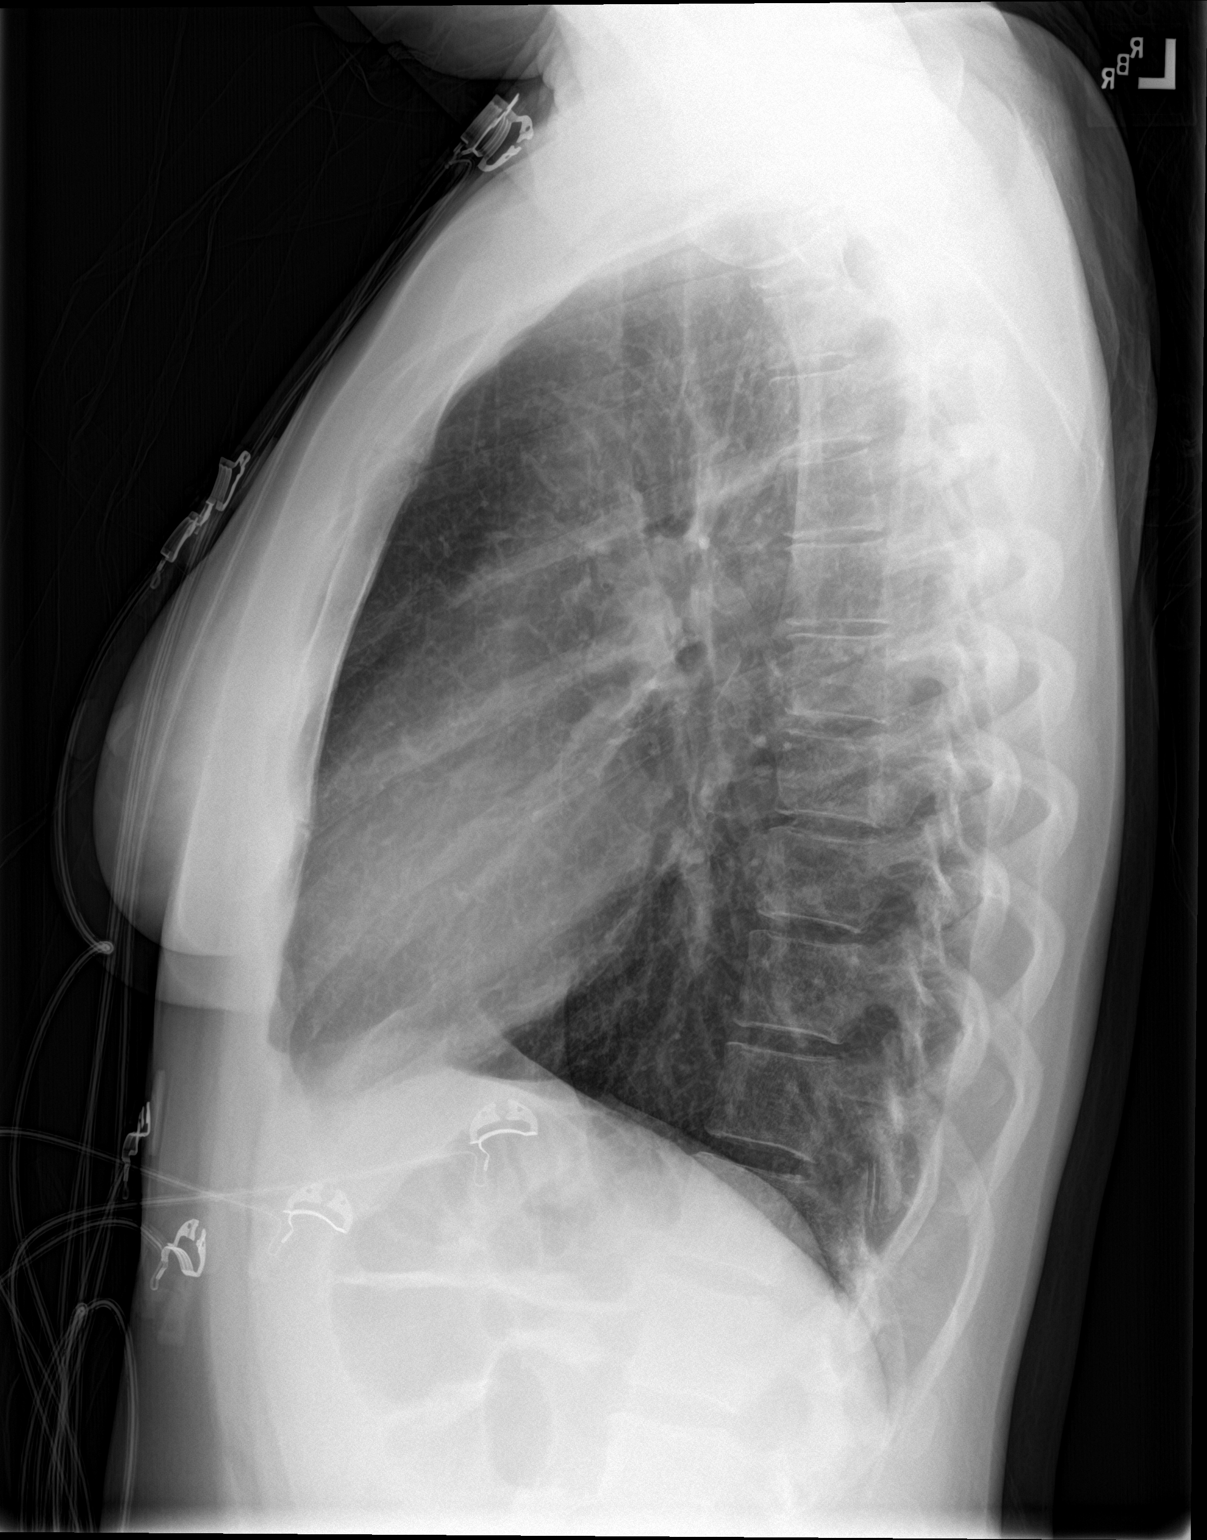

[2 of 2 positions shown; findings below may reference images not displayed]

FINDINGS: The cardiac silhouette, mediastinal and hilar contours are within
normal limits. The lungs are clear. No pleural effusion. The bony
thorax is intact.
IMPRESSION: No acute cardiopulmonary findings.
